# Patient Record
Sex: Female | Born: 1975 | Race: White | Hispanic: No | Smoking: Current every day smoker
Health system: Southern US, Community
[De-identification: ages and names within clinical notes are randomized; demographics above are authoritative.]

## PROBLEM LIST (undated history)

## (undated) DIAGNOSIS — M1990 Unspecified osteoarthritis, unspecified site: Secondary | ICD-10-CM

## (undated) DIAGNOSIS — F209 Schizophrenia, unspecified: Secondary | ICD-10-CM

## (undated) DIAGNOSIS — F4310 Post-traumatic stress disorder, unspecified: Secondary | ICD-10-CM

## (undated) DIAGNOSIS — J189 Pneumonia, unspecified organism: Secondary | ICD-10-CM

## (undated) DIAGNOSIS — M0890 Juvenile arthritis, unspecified, unspecified site: Secondary | ICD-10-CM

## (undated) DIAGNOSIS — K759 Inflammatory liver disease, unspecified: Secondary | ICD-10-CM

## (undated) DIAGNOSIS — R569 Unspecified convulsions: Secondary | ICD-10-CM

## (undated) DIAGNOSIS — F3181 Bipolar II disorder: Secondary | ICD-10-CM

## (undated) DIAGNOSIS — F1910 Other psychoactive substance abuse, uncomplicated: Secondary | ICD-10-CM

## (undated) HISTORY — PX: ABDOMINAL HYSTERECTOMY: SHX81

## (undated) HISTORY — PX: CARDIAC VALVE REPLACEMENT: SHX585

---

## 2008-08-01 ENCOUNTER — Emergency Department (HOSPITAL_COMMUNITY): Admission: EM | Admit: 2008-08-01 | Discharge: 2008-08-01 | Payer: Self-pay | Admitting: Emergency Medicine

## 2010-04-28 LAB — CBC
HCT: 38.8 % (ref 36.0–46.0)
Hemoglobin: 13.7 g/dL (ref 12.0–15.0)
Platelets: 166 10*3/uL (ref 150–400)
WBC: 4.6 10*3/uL (ref 4.0–10.5)

## 2010-04-28 LAB — POCT I-STAT, CHEM 8
BUN: 32 mg/dL — ABNORMAL HIGH (ref 6–23)
Hemoglobin: 13.6 g/dL (ref 12.0–15.0)
Sodium: 133 mEq/L — ABNORMAL LOW (ref 135–145)
TCO2: 23 mmol/L (ref 0–100)

## 2010-04-28 LAB — COMPREHENSIVE METABOLIC PANEL
AST: 29 U/L (ref 0–37)
Albumin: 3.9 g/dL (ref 3.5–5.2)
Calcium: 9.3 mg/dL (ref 8.4–10.5)
Chloride: 106 mEq/L (ref 96–112)
Creatinine, Ser: 0.72 mg/dL (ref 0.4–1.2)
GFR calc Af Amer: >60 mL/min (ref >60)
Total Protein: 7.2 g/dL (ref 6.0–8.3)

## 2010-04-28 LAB — BASIC METABOLIC PANEL
GFR calc Af Amer: >60 mL/min (ref >60)
GFR calc non Af Amer: >60 mL/min (ref >60)
Potassium: 3.2 mEq/L — ABNORMAL LOW (ref 3.5–5.1)
Sodium: 134 mEq/L — ABNORMAL LOW (ref 135–145)

## 2010-04-28 LAB — TRICYCLICS SCREEN, URINE: TCA Scrn: NOT DETECTED

## 2010-04-28 LAB — DIFFERENTIAL
Eosinophils Relative: 2 % (ref 0–5)
Lymphocytes Relative: 26 % (ref 12–46)
Lymphs Abs: 1.2 10*3/uL (ref 0.7–4.0)

## 2010-04-28 LAB — POCT CARDIAC MARKERS: Troponin i, poc: <0.05 ng/mL (ref 0.00–0.09)

## 2010-04-28 LAB — ETHANOL: Alcohol, Ethyl (B): <5 mg/dL (ref 0–10)

## 2016-05-08 ENCOUNTER — Emergency Department (HOSPITAL_BASED_OUTPATIENT_CLINIC_OR_DEPARTMENT_OTHER)
Admission: EM | Admit: 2016-05-08 | Discharge: 2016-05-08 | Disposition: A | Discharge status: Home / Self Care | Payer: Self-pay | Attending: Emergency Medicine | Admitting: Emergency Medicine

## 2016-05-08 ENCOUNTER — Encounter (HOSPITAL_BASED_OUTPATIENT_CLINIC_OR_DEPARTMENT_OTHER): Payer: Self-pay | Admitting: *Deleted

## 2016-05-08 DIAGNOSIS — F17200 Nicotine dependence, unspecified, uncomplicated: Secondary | ICD-10-CM | POA: Insufficient documentation

## 2016-05-08 DIAGNOSIS — L02416 Cutaneous abscess of left lower limb: Secondary | ICD-10-CM | POA: Insufficient documentation

## 2016-05-08 DIAGNOSIS — Z79899 Other long term (current) drug therapy: Secondary | ICD-10-CM | POA: Insufficient documentation

## 2016-05-08 HISTORY — DX: Bipolar II disorder: F31.81

## 2016-05-08 HISTORY — DX: Unspecified osteoarthritis, unspecified site: M19.90

## 2016-05-08 HISTORY — DX: Pneumonia, unspecified organism: J18.9

## 2016-05-08 HISTORY — DX: Other psychoactive substance abuse, uncomplicated: F19.10

## 2016-05-08 HISTORY — DX: Inflammatory liver disease, unspecified: K75.9

## 2016-05-08 HISTORY — DX: Juvenile arthritis, unspecified, unspecified site: M08.90

## 2016-05-08 MED ORDER — DOXYCYCLINE HYCLATE 100 MG PO CAPS: 100.0000 mg | ORAL_CAPSULE | Freq: Two times a day (BID) | ORAL | 0 refills | Status: DC

## 2016-05-08 MED ORDER — DOXYCYCLINE HYCLATE 100 MG PO TABS
100.0000 mg | ORAL_TABLET | Freq: Once | ORAL | Status: AC
  Administered 2016-05-08: 100 mg via ORAL
  Filled 2016-05-08: qty 1

## 2016-05-08 NOTE — ED Provider Notes (Signed)
[facility] Provider Note: Gloria Dell, MD, FACEP  CSN: [id number] MRN: [id number] ARRIVAL: 05/08/16 at 0316 ROOM: MH02/MH02   CHIEF COMPLAINT  Abscess   HISTORY OF PRESENT ILLNESS  Gloria Meyer is a 41 y.o. female with a history of IV drug abuse as well as a history of bacterial endocarditis. She is here with 2 small abscesses on her left knee had been present for about 1-2 weeks. One of them she has picked open and has been draining. The other has not been draining. They're moderately painful to palpation. She has not had a fever. 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 She is requesting antibiotics but is refusing I&D; she intends to follow-up at the Center where her surgery was done.  Past Medical History:  Diagnosis Date  . Arthritis   . Bipolar 2 disorder (HCC)   . Drug abuse    last use 11/17  . Hepatitis   . Juvenile arthritis (HCC)   . Pneumonia     Past Surgical History:  Procedure Laterality Date  . ABDOMINAL HYSTERECTOMY    . CARDIAC VALVE REPLACEMENT      History reviewed. No pertinent family history.  Social History  Substance Use Topics  . Smoking status: Current Every Day Smoker  . Smokeless tobacco: Never Used  . Alcohol use No    Prior to Admission medications   Medication Sig Start Date End Date Taking? Authorizing Provider  buPROPion (WELLBUTRIN XL) 300 MG 24 hr tablet Take 300 mg by mouth daily.   Yes Historical Provider, MD  methadone (DOLOPHINE) 10 MG tablet Take 60 mg by mouth daily.   Yes Historical Provider, MD  QUEtiapine (SEROQUEL) 25 MG tablet Take 50 mg by mouth at bedtime.   Yes Historical Provider, MD    Allergies Sulfa antibiotics   REVIEW OF SYSTEMS  Negative except as noted here or in the History of Present Illness.   PHYSICAL EXAMINATION  Initial Vital Signs Blood pressure (!) 146/109, pulse (!) 103, temperature 98.7  F (37.1 C), temperature source Oral, resp. rate 16, height  (1.651 m), weight 150 lb (68 kg), SpO2 100 %.  Examination General: Well-developed, well-nourished female in no acute distress; appearance consistent with age of record HENT: normocephalic; atraumatic Eyes: pupils equal, round and reactive to light; extraocular muscles intact Neck: supple Heart: regular rate and rhythm; no murmur Lungs: clear to auscultation bilaterally Chest: Well healing midline sternotomy incision Abdomen: soft; nondistended; nontender; bowel sounds present Extremities: No deformity; full range of motion; pulses normal Neurologic: Awake, alert and oriented; motor function intact in all extremities and symmetric; no facial droop Skin: Warm and dry; two lesions left knee, one with crusted center consistent with a draining abscess, the second tender and mildly fluctuant consistent with an early abscess Psychiatric: Normal mood and affect   RESULTS  Summary of this visit's results, reviewed by myself:   EKG Interpretation  Date/Time:    Ventricular Rate:    PR Interval:    QRS Duration:   QT Interval:    QTC Calculation:   R Axis:     Text Interpretation:        Laboratory Studies: No results found for this or any previous visit (from the past 24 hour(s)). Imaging Studies: No results found.  ED COURSE  Nursing notes and initial vitals signs, including pulse oximetry, reviewed.  Vitals:   05/08/16 0333  BP: Marland Kitchen)  146/109  Pulse: (!) 103  Resp: 16  Temp: 98.7 F (37.1 C)  TempSrc: Oral  SpO2: 100%  Weight: 150 lb (68 kg)  Height:  (1.651 m)   4:09 AM As noted above the patient is refusing I&D. We will start her on doxycycline. She intends to follow-up at the center that treated her for endocarditis. She was advised of the importance of returning should she develop fever, chills or other worsening symptoms.  PROCEDURES    ED DIAGNOSES     ICD-9-CM ICD-10-CM   1. Abscess of  left knee 682.6 L02.416        Paula Libra, MD 05/08/16 [phone]

## 2016-09-05 ENCOUNTER — Encounter (HOSPITAL_COMMUNITY): Payer: Self-pay | Admitting: Emergency Medicine

## 2016-09-05 ENCOUNTER — Emergency Department (HOSPITAL_COMMUNITY)
Admission: EM | Admit: 2016-09-05 | Discharge: 2016-09-05 | Disposition: A | Discharge status: Home / Self Care | Payer: Self-pay | Attending: Emergency Medicine | Admitting: Emergency Medicine

## 2016-09-05 DIAGNOSIS — R4182 Altered mental status, unspecified: Secondary | ICD-10-CM | POA: Insufficient documentation

## 2016-09-05 DIAGNOSIS — Y9389 Activity, other specified: Secondary | ICD-10-CM | POA: Insufficient documentation

## 2016-09-05 DIAGNOSIS — Y929 Unspecified place or not applicable: Secondary | ICD-10-CM | POA: Insufficient documentation

## 2016-09-05 DIAGNOSIS — Z79899 Other long term (current) drug therapy: Secondary | ICD-10-CM | POA: Insufficient documentation

## 2016-09-05 DIAGNOSIS — F17210 Nicotine dependence, cigarettes, uncomplicated: Secondary | ICD-10-CM | POA: Insufficient documentation

## 2016-09-05 DIAGNOSIS — Y999 Unspecified external cause status: Secondary | ICD-10-CM | POA: Insufficient documentation

## 2016-09-05 MED ORDER — SODIUM CHLORIDE 0.9 % IV BOLUS (SEPSIS): 1000.0000 mL | Freq: Once | INTRAVENOUS | Status: DC

## 2016-09-05 NOTE — ED Notes (Signed)
Patient refusing all scans, dr talked to her and patient states she is going to bed treated elsewhere. Pt ambulatory and states she is going to see another doctor is [place]

## 2016-09-05 NOTE — ED Notes (Signed)
Pt saying she is refusing blood work and Ct arrived to take pt and pt refused. Dr notified and explained to patient that it is important to at least get CT scans of body. Pt angry and says she does not want to be treated here
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
## 2017-01-10 ENCOUNTER — Encounter (HOSPITAL_BASED_OUTPATIENT_CLINIC_OR_DEPARTMENT_OTHER): Payer: Self-pay | Admitting: Emergency Medicine

## 2017-01-10 ENCOUNTER — Other Ambulatory Visit: Payer: Self-pay

## 2017-01-10 ENCOUNTER — Emergency Department (HOSPITAL_BASED_OUTPATIENT_CLINIC_OR_DEPARTMENT_OTHER)
Admission: EM | Admit: 2017-01-10 | Discharge: 2017-01-10 | Discharge status: Against Medical Advice | Payer: Self-pay | Attending: Emergency Medicine | Admitting: Emergency Medicine

## 2017-01-10 DIAGNOSIS — R569 Unspecified convulsions: Secondary | ICD-10-CM | POA: Insufficient documentation

## 2017-01-10 DIAGNOSIS — F17210 Nicotine dependence, cigarettes, uncomplicated: Secondary | ICD-10-CM | POA: Insufficient documentation

## 2017-01-10 DIAGNOSIS — Z79899 Other long term (current) drug therapy: Secondary | ICD-10-CM | POA: Insufficient documentation

## 2017-01-10 HISTORY — DX: Schizophrenia, unspecified: F20.9

## 2017-01-10 HISTORY — DX: Unspecified convulsions: R56.9

## 2017-01-10 HISTORY — DX: Post-traumatic stress disorder, unspecified: F43.10
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
## 2017-06-12 ENCOUNTER — Other Ambulatory Visit: Payer: Self-pay

## 2017-06-12 ENCOUNTER — Inpatient Hospital Stay (HOSPITAL_BASED_OUTPATIENT_CLINIC_OR_DEPARTMENT_OTHER)
Admission: EM | Admit: 2017-06-12 | Discharge: 2017-06-24 | DRG: 871 | Discharge status: Against Medical Advice | Payer: Self-pay | Attending: Internal Medicine | Admitting: Internal Medicine

## 2017-06-12 ENCOUNTER — Encounter (HOSPITAL_BASED_OUTPATIENT_CLINIC_OR_DEPARTMENT_OTHER): Payer: Self-pay | Admitting: *Deleted

## 2017-06-12 ENCOUNTER — Emergency Department (HOSPITAL_BASED_OUTPATIENT_CLINIC_OR_DEPARTMENT_OTHER): Payer: Self-pay

## 2017-06-12 DIAGNOSIS — B182 Chronic viral hepatitis C: Secondary | ICD-10-CM | POA: Diagnosis present

## 2017-06-12 DIAGNOSIS — Z452 Encounter for adjustment and management of vascular access device: Secondary | ICD-10-CM

## 2017-06-12 DIAGNOSIS — I5032 Chronic diastolic (congestive) heart failure: Secondary | ICD-10-CM | POA: Diagnosis present

## 2017-06-12 DIAGNOSIS — F17210 Nicotine dependence, cigarettes, uncomplicated: Secondary | ICD-10-CM | POA: Diagnosis present

## 2017-06-12 DIAGNOSIS — Z5321 Procedure and treatment not carried out due to patient leaving prior to being seen by health care provider: Secondary | ICD-10-CM | POA: Diagnosis present

## 2017-06-12 DIAGNOSIS — Y831 Surgical operation with implant of artificial internal device as the cause of abnormal reaction of the patient, or of later complication, without mention of misadventure at the time of the procedure: Secondary | ICD-10-CM | POA: Diagnosis present

## 2017-06-12 DIAGNOSIS — Z882 Allergy status to sulfonamides status: Secondary | ICD-10-CM

## 2017-06-12 DIAGNOSIS — R7881 Bacteremia: Secondary | ICD-10-CM | POA: Diagnosis present

## 2017-06-12 DIAGNOSIS — A4102 Sepsis due to Methicillin resistant Staphylococcus aureus: Principal | ICD-10-CM | POA: Diagnosis present

## 2017-06-12 DIAGNOSIS — R6520 Severe sepsis without septic shock: Secondary | ICD-10-CM | POA: Diagnosis present

## 2017-06-12 DIAGNOSIS — I330 Acute and subacute infective endocarditis: Secondary | ICD-10-CM | POA: Diagnosis present

## 2017-06-12 DIAGNOSIS — B1920 Unspecified viral hepatitis C without hepatic coma: Secondary | ICD-10-CM | POA: Diagnosis present

## 2017-06-12 DIAGNOSIS — F1990 Other psychoactive substance use, unspecified, uncomplicated: Secondary | ICD-10-CM | POA: Diagnosis present

## 2017-06-12 DIAGNOSIS — F3181 Bipolar II disorder: Secondary | ICD-10-CM | POA: Diagnosis present

## 2017-06-12 DIAGNOSIS — F209 Schizophrenia, unspecified: Secondary | ICD-10-CM | POA: Diagnosis present

## 2017-06-12 DIAGNOSIS — I4581 Long QT syndrome: Secondary | ICD-10-CM | POA: Diagnosis present

## 2017-06-12 DIAGNOSIS — L02415 Cutaneous abscess of right lower limb: Secondary | ICD-10-CM | POA: Diagnosis present

## 2017-06-12 DIAGNOSIS — T826XXD Infection and inflammatory reaction due to cardiac valve prosthesis, subsequent encounter: Secondary | ICD-10-CM

## 2017-06-12 DIAGNOSIS — B9562 Methicillin resistant Staphylococcus aureus infection as the cause of diseases classified elsewhere: Secondary | ICD-10-CM | POA: Diagnosis present

## 2017-06-12 DIAGNOSIS — D703 Neutropenia due to infection: Secondary | ICD-10-CM | POA: Diagnosis present

## 2017-06-12 DIAGNOSIS — Z90710 Acquired absence of both cervix and uterus: Secondary | ICD-10-CM

## 2017-06-12 DIAGNOSIS — N179 Acute kidney failure, unspecified: Secondary | ICD-10-CM | POA: Diagnosis not present

## 2017-06-12 DIAGNOSIS — L03115 Cellulitis of right lower limb: Secondary | ICD-10-CM | POA: Diagnosis present

## 2017-06-12 DIAGNOSIS — M25552 Pain in left hip: Secondary | ICD-10-CM | POA: Diagnosis present

## 2017-06-12 DIAGNOSIS — G4700 Insomnia, unspecified: Secondary | ICD-10-CM | POA: Diagnosis present

## 2017-06-12 DIAGNOSIS — Z8679 Personal history of other diseases of the circulatory system: Secondary | ICD-10-CM

## 2017-06-12 DIAGNOSIS — D649 Anemia, unspecified: Secondary | ICD-10-CM | POA: Diagnosis present

## 2017-06-12 DIAGNOSIS — M7121 Synovial cyst of popliteal space [Baker], right knee: Secondary | ICD-10-CM | POA: Diagnosis present

## 2017-06-12 DIAGNOSIS — E876 Hypokalemia: Secondary | ICD-10-CM | POA: Diagnosis present

## 2017-06-12 DIAGNOSIS — Z952 Presence of prosthetic heart valve: Secondary | ICD-10-CM

## 2017-06-12 DIAGNOSIS — I38 Endocarditis, valve unspecified: Secondary | ICD-10-CM

## 2017-06-12 DIAGNOSIS — Z4682 Encounter for fitting and adjustment of non-vascular catheter: Secondary | ICD-10-CM

## 2017-06-12 DIAGNOSIS — D6959 Other secondary thrombocytopenia: Secondary | ICD-10-CM | POA: Diagnosis present

## 2017-06-12 DIAGNOSIS — Z8614 Personal history of Methicillin resistant Staphylococcus aureus infection: Secondary | ICD-10-CM

## 2017-06-12 DIAGNOSIS — M25561 Pain in right knee: Secondary | ICD-10-CM | POA: Diagnosis not present

## 2017-06-12 DIAGNOSIS — Z79899 Other long term (current) drug therapy: Secondary | ICD-10-CM

## 2017-06-12 DIAGNOSIS — E875 Hyperkalemia: Secondary | ICD-10-CM | POA: Diagnosis not present

## 2017-06-12 DIAGNOSIS — T826XXA Infection and inflammatory reaction due to cardiac valve prosthesis, initial encounter: Secondary | ICD-10-CM | POA: Diagnosis present

## 2017-06-12 DIAGNOSIS — F4310 Post-traumatic stress disorder, unspecified: Secondary | ICD-10-CM | POA: Diagnosis present

## 2017-06-12 DIAGNOSIS — M79674 Pain in right toe(s): Secondary | ICD-10-CM | POA: Diagnosis not present

## 2017-06-12 DIAGNOSIS — B373 Candidiasis of vulva and vagina: Secondary | ICD-10-CM | POA: Diagnosis not present

## 2017-06-12 DIAGNOSIS — G40909 Epilepsy, unspecified, not intractable, without status epilepticus: Secondary | ICD-10-CM | POA: Diagnosis present

## 2017-06-12 DIAGNOSIS — F1110 Opioid abuse, uncomplicated: Secondary | ICD-10-CM | POA: Diagnosis present

## 2017-06-12 DIAGNOSIS — R52 Pain, unspecified: Secondary | ICD-10-CM

## 2017-06-12 LAB — I-STAT CG4 LACTIC ACID, ED: Lactic Acid, Venous: 1.69 mmol/L (ref 0.5–1.9)

## 2017-06-12 MED ORDER — SODIUM CHLORIDE 0.9 % IV BOLUS
1000.0000 mL | Freq: Once | INTRAVENOUS | Status: AC
  Administered 2017-06-13: 1000 mL via INTRAVENOUS

## 2017-06-12 NOTE — ED Provider Notes (Signed)
[facility] EMERGENCY DEPARTMENT Provider Note   CSN: [id number] Arrival date & time: 06/12/17  2136     History   Chief Complaint Chief Complaint  Patient presents with  . Fever  . Cough    HPI Gloria Meyer is a 42 y.o. female with history of juvenile rheumatoid arthritis, bipolar 2 disorder, IV drug use, schizophrenia, post traumatic stress disorder presents for evaluation of acute onset, per aggressively worsening cough for 3 weeks as well as fever for 1 week.  She notes cough is productive of yellow-green sputum and she has noted progressively worsening shortness of breath over the past few weeks.  She states that she continues to inject heroin daily and noted to areas of swelling and purulent drainage to the right thigh this week where she recently injected.  She notes some tenderness to this area.  She also notes generalized myalgias as well as pain to the left great toe, left hip, and left shoulder.  This worsens with movement and palpation.  She states she has been febrile to 5 F for the past week with improvement with Tylenol.  She endorses nausea but no vomiting.  She tells me that "I do not really feel that the heroin is working anymore ".  She states "this feels like the last time I was septic ".  She is a current smoker but has not been able to smoke for the past week due to her symptoms.  The history is provided by the patient.    Past Medical History:  Diagnosis Date  . Arthritis   . Bipolar 2 disorder (HCC)   . Drug abuse (HCC)    last use 11/17  . Hepatitis   . Juvenile arthritis (HCC)   . Pneumonia   . PTSD (post-traumatic stress disorder)   . Schizophrenia (HCC)   . Seizures (HCC)     There are no active problems to display for this patient.   Past Surgical History:  Procedure Laterality Date  . ABDOMINAL HYSTERECTOMY    . CARDIAC VALVE REPLACEMENT       OB History    Gravida  3   Para  3   Term      Preterm      AB      Living         SAB      TAB      Ectopic      Multiple      Live Births  3            Home Medications    Prior to Admission medications   Medication Sig Start Date End Date Taking? Authorizing Provider  buPROPion (WELLBUTRIN XL) 300 MG 24 hr tablet Take 300 mg by mouth daily.    [provider]  doxycycline (VIBRAMYCIN) 100 MG capsule Take 1 capsule (100 mg total) by mouth 2 (two) times daily. 05/08/16   Molpus, John, MD  methadone (DOLOPHINE) 10 MG tablet Take 60 mg by mouth daily.    [provider]  QUEtiapine (SEROQUEL) 25 MG tablet Take 50 mg by mouth at bedtime.    [provider]    Family History No family history on file.  Social History Social History   Tobacco Use  . Smoking status: Current Every Day Smoker    Types: Cigarettes  . Smokeless tobacco: Never Used  Substance Use Topics  . Alcohol use: Yes    Comment: occasional beer  . Drug use: Yes  Types: IV     Allergies   Sulfa antibiotics   Review of Systems Review of Systems  Constitutional: Positive for chills, fatigue and fever.  Respiratory: Positive for cough and shortness of breath.   Gastrointestinal: Positive for nausea. Negative for abdominal pain and vomiting.  Musculoskeletal: Positive for arthralgias and myalgias.  All other systems reviewed and are negative.    Physical Exam Updated Vital Signs BP 104/82   Pulse (!) 105   Temp (S) 98 F (36.7 C) (Oral) Comment: Reports she took tylenol around 9 pm  Resp 18   Ht  (1.651 m)   Wt 61.6 kg (135 lb 11.2 oz)   SpO2 100%   BMI 22.58 kg/m   Physical Exam  Constitutional: She appears well-developed and well-nourished. No distress.  Resting in bed, appears older than stated age  HENT:  Head: Normocephalic and atraumatic.  Eyes: Conjunctivae are normal. Right eye exhibits no discharge. Left eye exhibits no discharge.  Neck: Normal range of motion. No JVD present. No tracheal deviation present.    Cardiovascular: Regular rhythm. Exam reveals no gallop and no friction rub.  No murmur heard. Tachycardic, 2+ DP/PT pulses and radial pulses bilaterally  Pulmonary/Chest: Effort normal and breath sounds normal. No respiratory distress. She has no wheezes. She has no rales.  Abdominal: Soft. Bowel sounds are normal. She exhibits no distension. There is no tenderness. There is no guarding.  Musculoskeletal: Normal range of motion. She exhibits tenderness. She exhibits no edema or deformity.  Diffuse generalized tenderness to palpation of the extremity is particularly at the left shoulder, left hip, and left great toe.  No erythema or warmth noted to the joints.  Normal range of motion although pain is elicited with flexion extension of the aforementioned joints.  5/5 strength of BUE and BLE major muscle groups.  Neurological: She is alert. No sensory deficit.  Fluent speech, no facial droop, sensation intact to soft touch of extremities, ambulatory without difficulty exhibiting good gait and balance  Skin: Skin is warm and dry. No erythema.  2 scabs noted to the anterior aspect of the right thigh.  No active drainage.  Mild surrounding erythema and induration.  No obvious fluctuance noted.  Track marks noted to the upper and lower extremities.  Psychiatric: She has a normal mood and affect. Her behavior is normal.  Nursing note and vitals reviewed.    ED Treatments / Results  Labs (all labs ordered are listed, but only abnormal results are displayed) Labs Reviewed  CBC WITH DIFFERENTIAL/PLATELET - Abnormal; Notable for the following components:      Result Value   WBC 2.8 (*)    HCT 35.9 (*)    Platelets 79 (*)    All other components within normal limits  CULTURE, BLOOD (ROUTINE X 2)  CULTURE, BLOOD (ROUTINE X 2)  CULTURE, BLOOD (SINGLE)  COMPREHENSIVE METABOLIC PANEL  URINALYSIS, ROUTINE W REFLEX MICROSCOPIC  I-STAT CG4 LACTIC ACID, ED  I-STAT CG4 LACTIC ACID, ED    EKG EKG  Interpretation  Date/Time:  Saturday Jun 12 2017 22:26:32 EDT Ventricular Rate:  97 PR Interval:    QRS Duration: 98 QT Interval:  414 QTC Calculation: 526 R Axis:   73 Text Interpretation:  Sinus rhythm Borderline short PR interval Probable left atrial enlargement RSR' in V1 or V2, probably normal variant Borderline T wave abnormalities Prolonged QT interval t wave inversions in v3-v5 and II and avf are new since 2010 QT has lengthened Confirmed by Particia Nearing,  Raynelle Fanning [phone]) on 06/12/2017 10:32:04 PM   Radiology Dg Chest 2 View  Result Date: 06/12/2017 CLINICAL DATA:  Femur abscess, history of heart surgery EXAM: CHEST - 2 VIEW COMPARISON:  12/20/2015 FINDINGS: Post sternotomy changes with valve prosthesis. No acute opacity or pleural effusion. Normal heart size. No pneumothorax. IMPRESSION: No active cardiopulmonary disease. Electronically Signed   By: Jasmine Pang M.D.   On: 06/12/2017 22:35   Dg Femur, Min 2 Views Right  Result Date: 06/12/2017 CLINICAL DATA:  Right femur abscess near medial distal aspect of the anterior femur EXAM: RIGHT FEMUR 2 VIEWS COMPARISON:  None. FINDINGS: No acute fracture or malalignment. No periostitis or bone destruction. Soft tissue swelling distal thigh. IMPRESSION: No acute osseous abnormality Electronically Signed   By: Jasmine Pang M.D.   On: 06/12/2017 22:34    Procedures Procedures (including critical care time)  Medications Ordered in ED Medications  sodium chloride 0.9 % bolus 1,000 mL (has no administration in time range)  rifampin (RIFADIN) 300 mg in sodium chloride 0.9 % 100 mL IVPB (has no administration in time range)  piperacillin-tazobactam (ZOSYN) IVPB 3.375 g (has no administration in time range)     Initial Impression / Assessment and Plan / ED Course  I have reviewed the triage vital signs and the nursing notes.  Pertinent labs & imaging results that were available during my care of the patient were reviewed by me and considered  in my medical decision making (see chart for details).     Patient presents for evaluation of cough for 3 weeks, fever for 1 week and possible abscesses to the right thigh secondary to IV drug use.  She is afebrile but took Tylenol 1 hour prior to assessment.  She is mildly tachycardic and blood pressure is borderline hypotensive.  X-rays show no acute cardiopulmonary abnormality suggestive of pneumonia or pleural effusion.  X-rays of the femur show soft tissue swelling of the distal thigh but no retained needles.  There are cellulitic changes to the skin of the right thigh but no obvious abscess.  Blood work was obtained including 2 blood cultures however this was delayed due to difficulty obtaining IV access.  Lab work thus far shows lactate within normal limits, neutropenia and thrombocytopenia.  Care everywhere shows that she recently had lab work on 04/17/2017 which showed WBC count of 11 and platelet count of 150. 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  Vancomycin, Zosyn, and rifampin have been ordered.  We are awaiting the remainder of her lab work.  12:22 AM Signed out to oncoming provider Dr. Nicanor Alcon.  Awaiting remainder of lab work.  Patient will likely require admission for TEE and evaluation of possible infected tricuspid valve replacement.  Final Clinical Impressions(s) / ED Diagnoses   Final diagnoses:  Neutropenia associated with infection (HCC)  Cellulitis of right lower extremity  IVDU (intravenous drug user)    ED Discharge Orders    None       Bennye Alm 06/13/17 Juventino Slovak, MD 06/13/17 1945

## 2017-06-12 NOTE — ED Triage Notes (Signed)
Pt reports she has hx of heart valve replacement from IV drug use. States she has been using heroin x 4 months. States last used heroin 2 days ago but has been using methadone instead. Reports she has a wound on her right knee and has been running fevers. Also c/o productive cough and feeling bad. Concerned for bloodstream infection

## 2017-06-12 NOTE — ED Notes (Signed)
IV attempt x 3 without success. Pt has hx of IV drug abuse and is a difficult stick.

## 2017-06-12 NOTE — ED Notes (Signed)
PIV attempted x 2 in [facility], unsuccessful.

## 2017-06-13 ENCOUNTER — Encounter (HOSPITAL_COMMUNITY): Payer: Self-pay | Admitting: Internal Medicine

## 2017-06-13 ENCOUNTER — Observation Stay: Payer: Self-pay

## 2017-06-13 ENCOUNTER — Inpatient Hospital Stay (HOSPITAL_COMMUNITY): Payer: Self-pay

## 2017-06-13 ENCOUNTER — Other Ambulatory Visit (HOSPITAL_COMMUNITY): Payer: Self-pay

## 2017-06-13 DIAGNOSIS — L02415 Cutaneous abscess of right lower limb: Secondary | ICD-10-CM

## 2017-06-13 DIAGNOSIS — Z8679 Personal history of other diseases of the circulatory system: Secondary | ICD-10-CM

## 2017-06-13 DIAGNOSIS — F3181 Bipolar II disorder: Secondary | ICD-10-CM | POA: Diagnosis present

## 2017-06-13 DIAGNOSIS — L089 Local infection of the skin and subcutaneous tissue, unspecified: Secondary | ICD-10-CM | POA: Insufficient documentation

## 2017-06-13 DIAGNOSIS — E876 Hypokalemia: Secondary | ICD-10-CM | POA: Diagnosis present

## 2017-06-13 DIAGNOSIS — B1920 Unspecified viral hepatitis C without hepatic coma: Secondary | ICD-10-CM | POA: Diagnosis present

## 2017-06-13 DIAGNOSIS — F1110 Opioid abuse, uncomplicated: Secondary | ICD-10-CM | POA: Diagnosis present

## 2017-06-13 DIAGNOSIS — T148XXA Other injury of unspecified body region, initial encounter: Secondary | ICD-10-CM

## 2017-06-13 LAB — TROPONIN I: Troponin I: <0.03 ng/mL (ref <0.03)

## 2017-06-13 LAB — COMPREHENSIVE METABOLIC PANEL
ALT: 20 U/L (ref 14–54)
AST: 30 U/L (ref 15–41)
Albumin: 2.8 g/dL — ABNORMAL LOW (ref 3.5–5.0)
Alkaline Phosphatase: 106 U/L (ref 38–126)
Anion gap: 10 (ref 5–15)
BUN: 19 mg/dL (ref 6–20)
CO2: 24 mmol/L (ref 22–32)
Calcium: 8.2 mg/dL — ABNORMAL LOW (ref 8.9–10.3)
Chloride: 96 mmol/L — ABNORMAL LOW (ref 101–111)
Creatinine, Ser: 1.21 mg/dL — ABNORMAL HIGH (ref 0.44–1.00)
GFR calc Af Amer: >60 mL/min (ref >60)
GFR calc non Af Amer: 55 mL/min — ABNORMAL LOW (ref >60)
Glucose, Bld: 93 mg/dL (ref 65–99)
Potassium: 3 mmol/L — ABNORMAL LOW (ref 3.5–5.1)
Sodium: 130 mmol/L — ABNORMAL LOW (ref 135–145)
Total Bilirubin: 1.3 mg/dL — ABNORMAL HIGH (ref 0.3–1.2)
Total Protein: 6.5 g/dL (ref 6.5–8.1)

## 2017-06-13 LAB — RAPID URINE DRUG SCREEN, HOSP PERFORMED
AMPHETAMINES: POSITIVE — AB
BENZODIAZEPINES: NOT DETECTED
Barbiturates: NOT DETECTED
Cocaine: POSITIVE — AB
OPIATES: POSITIVE — AB
TETRAHYDROCANNABINOL: NOT DETECTED

## 2017-06-13 LAB — BLOOD CULTURE ID PANEL (REFLEXED)

## 2017-06-13 LAB — CBC WITH DIFFERENTIAL/PLATELET
Basophils Absolute: 0 10*3/uL (ref 0.0–0.1)
Basophils Relative: 0 %
Eosinophils Absolute: 0 10*3/uL (ref 0.0–0.7)
Eosinophils Relative: 0 %
HCT: 35.9 % — ABNORMAL LOW (ref 36.0–46.0)
Hemoglobin: 12.5 g/dL (ref 12.0–15.0)
Lymphocytes Relative: 8 %
Lymphs Abs: 0.2 10*3/uL — ABNORMAL LOW (ref 0.7–4.0)
MCH: 31.4 pg (ref 26.0–34.0)
MCHC: 34.8 g/dL (ref 30.0–36.0)
MCV: 90.2 fL (ref 78.0–100.0)
Monocytes Absolute: 0.2 10*3/uL (ref 0.1–1.0)
Monocytes Relative: 6 %
Neutro Abs: 2.4 10*3/uL (ref 1.7–7.7)
Neutrophils Relative %: 86 %
Platelets: 79 10*3/uL — ABNORMAL LOW (ref 150–400)
RBC: 3.98 MIL/uL (ref 3.87–5.11)
RDW: 14.2 % (ref 11.5–15.5)
Smear Review: DECREASED
WBC: 2.8 10*3/uL — ABNORMAL LOW (ref 4.0–10.5)

## 2017-06-13 LAB — CBC
HCT: 35.6 % — ABNORMAL LOW (ref 36.0–46.0)
HEMOGLOBIN: 11.8 g/dL — AB (ref 12.0–15.0)
MCH: 30.7 pg (ref 26.0–34.0)
MCHC: 33.1 g/dL (ref 30.0–36.0)
MCV: 92.7 fL (ref 78.0–100.0)
PLATELETS: 52 10*3/uL — AB (ref 150–400)
RBC: 3.84 MIL/uL — ABNORMAL LOW (ref 3.87–5.11)
RDW: 14.7 % (ref 11.5–15.5)
WBC: 4.7 10*3/uL (ref 4.0–10.5)

## 2017-06-13 LAB — URINALYSIS, ROUTINE W REFLEX MICROSCOPIC
Bilirubin Urine: NEGATIVE
Glucose, UA: NEGATIVE mg/dL
Ketones, ur: NEGATIVE mg/dL
Leukocytes, UA: NEGATIVE
Nitrite: NEGATIVE
Protein, ur: NEGATIVE mg/dL
Specific Gravity, Urine: <1.005 — ABNORMAL LOW (ref 1.005–1.030)
pH: 6.5 (ref 5.0–8.0)

## 2017-06-13 LAB — URINALYSIS, MICROSCOPIC (REFLEX)

## 2017-06-13 LAB — CREATININE, SERUM
Creatinine, Ser: 0.86 mg/dL (ref 0.44–1.00)
GFR calc Af Amer: >60 mL/min (ref >60)
GFR calc non Af Amer: >60 mL/min (ref >60)

## 2017-06-13 LAB — SEDIMENTATION RATE: Sed Rate: 37 mm/hr — ABNORMAL HIGH (ref 0–22)

## 2017-06-13 LAB — MAGNESIUM: MAGNESIUM: 2 mg/dL (ref 1.7–2.4)

## 2017-06-13 MED ORDER — SODIUM CHLORIDE 0.9% FLUSH
10.0000 mL | Freq: Two times a day (BID) | INTRAVENOUS | Status: DC
  Administered 2017-06-15 – 2017-06-16 (×2): 10 mL
  Administered 2017-06-16: 3 mL
  Administered 2017-06-17 – 2017-06-24 (×3): 10 mL

## 2017-06-13 MED ORDER — ACETAMINOPHEN 325 MG PO TABS
650.0000 mg | ORAL_TABLET | Freq: Four times a day (QID) | ORAL | Status: DC | PRN
  Administered 2017-06-13 – 2017-06-24 (×9): 650 mg via ORAL
  Filled 2017-06-13 (×10): qty 2

## 2017-06-13 MED ORDER — VANCOMYCIN HCL IN DEXTROSE 1-5 GM/200ML-% IV SOLN
1000.0000 mg | INTRAVENOUS | Status: DC
  Administered 2017-06-13 – 2017-06-14 (×2): 1000 mg via INTRAVENOUS
  Filled 2017-06-13 (×2): qty 200

## 2017-06-13 MED ORDER — PIPERACILLIN-TAZOBACTAM 3.375 G IVPB 30 MIN
3.3750 g | Freq: Once | INTRAVENOUS | Status: AC
  Administered 2017-06-13: 3.375 g via INTRAVENOUS
  Filled 2017-06-13 (×2): qty 50

## 2017-06-13 MED ORDER — IOPAMIDOL (ISOVUE-300) INJECTION 61%
INTRAVENOUS | Status: AC
  Filled 2017-06-13: qty 100

## 2017-06-13 MED ORDER — ENOXAPARIN SODIUM 40 MG/0.4ML ~~LOC~~ SOLN
40.0000 mg | Freq: Every day | SUBCUTANEOUS | Status: DC
  Administered 2017-06-13 – 2017-06-24 (×12): 40 mg via SUBCUTANEOUS
  Filled 2017-06-13 (×11): qty 0.4

## 2017-06-13 MED ORDER — METHADONE HCL 10 MG PO TABS
60.0000 mg | ORAL_TABLET | Freq: Every day | ORAL | Status: DC
  Administered 2017-06-13 – 2017-06-16 (×4): 60 mg via ORAL
  Filled 2017-06-13 (×4): qty 6

## 2017-06-13 MED ORDER — SODIUM CHLORIDE 0.9 % IV BOLUS
1000.0000 mL | Freq: Once | INTRAVENOUS | Status: AC
  Administered 2017-06-13: 1000 mL via INTRAVENOUS

## 2017-06-13 MED ORDER — ONDANSETRON HCL 4 MG/2ML IJ SOLN
4.0000 mg | Freq: Four times a day (QID) | INTRAMUSCULAR | Status: DC | PRN
  Administered 2017-06-19 (×2): 4 mg via INTRAVENOUS
  Filled 2017-06-13 (×2): qty 2

## 2017-06-13 MED ORDER — PIPERACILLIN-TAZOBACTAM 3.375 G IVPB
3.3750 g | Freq: Three times a day (TID) | INTRAVENOUS | Status: DC
  Administered 2017-06-13: 3.375 g via INTRAVENOUS
  Filled 2017-06-13: qty 50

## 2017-06-13 MED ORDER — QUETIAPINE FUMARATE 50 MG PO TABS
50.0000 mg | ORAL_TABLET | Freq: Every day | ORAL | Status: DC
  Administered 2017-06-15 – 2017-06-16 (×2): 50 mg via ORAL
  Filled 2017-06-13 (×4): qty 1

## 2017-06-13 MED ORDER — POTASSIUM CHLORIDE CRYS ER 20 MEQ PO TBCR
80.0000 meq | EXTENDED_RELEASE_TABLET | Freq: Once | ORAL | Status: AC
  Administered 2017-06-13: 80 meq via ORAL
  Filled 2017-06-13: qty 4

## 2017-06-13 MED ORDER — SODIUM CHLORIDE 0.9% FLUSH: 10.0000 mL | INTRAVENOUS | Status: DC | PRN

## 2017-06-13 MED ORDER — ACETAMINOPHEN 650 MG RE SUPP: 650.0000 mg | Freq: Four times a day (QID) | RECTAL | Status: DC | PRN

## 2017-06-13 MED ORDER — ONDANSETRON HCL 4 MG PO TABS
4.0000 mg | ORAL_TABLET | Freq: Four times a day (QID) | ORAL | Status: DC | PRN
  Administered 2017-06-16 – 2017-06-20 (×2): 4 mg via ORAL
  Filled 2017-06-13 (×2): qty 1

## 2017-06-13 MED ORDER — BUPROPION HCL ER (XL) 300 MG PO TB24
300.0000 mg | ORAL_TABLET | Freq: Every day | ORAL | Status: DC
  Administered 2017-06-13 – 2017-06-19 (×5): 300 mg via ORAL
  Filled 2017-06-13 (×11): qty 1

## 2017-06-13 MED ORDER — VANCOMYCIN HCL IN DEXTROSE 1-5 GM/200ML-% IV SOLN
1000.0000 mg | Freq: Once | INTRAVENOUS | Status: AC
  Administered 2017-06-13: 1000 mg via INTRAVENOUS
  Filled 2017-06-13: qty 200

## 2017-06-13 MED ORDER — SODIUM CHLORIDE 0.9 % IV SOLN
300.0000 mg | Freq: Three times a day (TID) | INTRAVENOUS | Status: DC
  Administered 2017-06-13 – 2017-06-14 (×4): 300 mg via INTRAVENOUS
  Filled 2017-06-13 (×6): qty 300

## 2017-06-13 MED ORDER — IBUPROFEN 800 MG PO TABS
800.0000 mg | ORAL_TABLET | Freq: Four times a day (QID) | ORAL | Status: DC | PRN
  Administered 2017-06-13 – 2017-06-22 (×10): 800 mg via ORAL
  Filled 2017-06-13 (×11): qty 1

## 2017-06-13 NOTE — Procedures (Signed)
Central Venous Catheter Insertion Procedure Note Gloria Meyer [id number] 11-Apr-1975  Procedure: Insertion of Central Venous Catheter Indications: Drug and/or fluid administration  Procedure Details Consent: Risks of procedure as well as the alternatives and risks of each were explained to the (patient/caregiver).  Consent for procedure obtained. Time Out: Verified patient identification, verified procedure, site/side was marked, verified correct patient position, special equipment/implants available, medications/allergies/relevent history reviewed, required imaging and test results available.  Performed  Maximum sterile technique was used including antiseptics, cap, gloves, gown, hand hygiene, mask and sheet. Skin prep: Chlorhexidine; local anesthetic administered A antimicrobial bonded/coated triple lumen catheter was placed in the right internal jugular vein using the Seldinger technique.  Evaluation Blood flow good Complications: No apparent complications Patient did tolerate procedure well. Chest X-ray ordered to verify placement.  CXR: pending.  Gloria Meyer 06/13/2017, 3:06 PM

## 2017-06-13 NOTE — Progress Notes (Signed)
PHARMACY - PHYSICIAN COMMUNICATION CRITICAL VALUE ALERT - BLOOD CULTURE IDENTIFICATION (BCID)  Gloria Meyer is an 43 y.o. female who presented to [facility] on 06/12/2017 with a chief complaint of thigh abscess, hx MRSA endocarditis .  Pharmacy has been consulted for vancomycin dosing.   Assessment:  Hx Heroin abuse, hx MRSA tricuspid valve endocarditis. ID automatically notified by e-mail with Staph aureus BCID  Name of physician (or Provider) ContactedAlvino Chapel  Current antibiotics: Vancomycin, Zosyn, Rifampin  Changes to prescribed antibiotics recommended:  Patient is on recommended antibiotics - No changes needed  Results for orders placed or performed during the hospital encounter of 06/12/17  Blood Culture ID Panel (Reflexed) (Collected: 06/12/2017 11:36 PM)  Result Value Ref Range   Enterococcus species NOT DETECTED NOT DETECTED   Listeria monocytogenes NOT DETECTED NOT DETECTED   Staphylococcus species DETECTED (A) NOT DETECTED   Staphylococcus aureus DETECTED (A) NOT DETECTED   Methicillin resistance DETECTED (A) NOT DETECTED   Streptococcus species NOT DETECTED NOT DETECTED   Streptococcus agalactiae NOT DETECTED NOT DETECTED   Streptococcus pneumoniae NOT DETECTED NOT DETECTED   Streptococcus pyogenes NOT DETECTED NOT DETECTED   Acinetobacter baumannii NOT DETECTED NOT DETECTED   Enterobacteriaceae species NOT DETECTED NOT DETECTED   Enterobacter cloacae complex NOT DETECTED NOT DETECTED   Escherichia coli NOT DETECTED NOT DETECTED   Klebsiella oxytoca NOT DETECTED NOT DETECTED   Klebsiella pneumoniae NOT DETECTED NOT DETECTED   Proteus species NOT DETECTED NOT DETECTED   Serratia marcescens NOT DETECTED NOT DETECTED   Haemophilus influenzae NOT DETECTED NOT DETECTED   Neisseria meningitidis NOT DETECTED NOT DETECTED   Pseudomonas aeruginosa NOT DETECTED NOT DETECTED   Candida albicans NOT DETECTED NOT DETECTED   Candida glabrata NOT DETECTED NOT DETECTED   Candida  krusei NOT DETECTED NOT DETECTED   Candida parapsilosis NOT DETECTED NOT DETECTED   Candida tropicalis NOT DETECTED NOT DETECTED    Chilton Si, Alessio Bogan L 06/13/2017  3:25 PM

## 2017-06-13 NOTE — Progress Notes (Signed)
Spoke with Dr Alvino Chapel re PICC vs midline, blood cultures pending, pt with significant hx of infections and concern re IVDU with PICC line.  Dr. Alvino Chapel orders to place midline not PICC line.

## 2017-06-13 NOTE — H&P (Signed)
History and Physical    Cooper Stamp ZOX:[id number] DOB: November 16, 1975 DOA: 06/12/2017  PCP: Patient, No Pcp Per  Patient coming from: Home  Chief Complaint: Fevers, chills, general malaise, right leg abscesses   HPI: Gloria Meyer is a 42 y.o. 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  She states that she relapsed about 3 months ago and has been injecting heroin into her right thigh.  About 2 weeks ago, she noticed 2 sites of abscess.  She lysed it open with a Narcan needle at home and drained white pus that was mixed with brown.  Then about 5 days ago, she started to feel ill with general malaise, fatigue, fever, chills, sweats.  She also admits to productive cough of yellow sputum.  She denies any chest pain.  Has some nausea and has not been able to keep down any food for the past 5 days.  No abdominal pain or diarrhea.  ED Course: Chest x-ray negative for acute cardiopulmonary abnormality.  X-ray right femur shows soft tissue swelling of the distal thigh but no retained needle or osseous abnormality.  Blood cultures obtained.  She was started on IV vancomycin, Zosyn, rifampin. [facility] asked for admission for possible endocarditis, sepsis work up.   Review of Systems: As per HPI otherwise 10 point review of systems negative.   Past Medical History:  Diagnosis Date  . Arthritis   . Bipolar 2 disorder (HCC)   . Drug abuse (HCC)    last use 11/17  . Hepatitis   . Juvenile arthritis (HCC)   . Pneumonia   . PTSD (post-traumatic stress disorder)   . Schizophrenia (HCC)   . Seizures (HCC)     Past Surgical History:  Procedure Laterality Date  . ABDOMINAL HYSTERECTOMY    . CARDIAC VALVE REPLACEMENT       reports that she has been smoking cigarettes.  She has never used smokeless tobacco. She reports that she drinks alcohol. She reports that she has current or past drug history. Drug: IV.  Allergies  Allergen Reactions  . Sulfa Antibiotics Rash    Family History  Problem Relation Age of Onset  . Diabetes Father      Prior to Admission medications   Medication Sig Start Date End Date Taking? Authorizing Provider  buPROPion (WELLBUTRIN XL) 300 MG 24 hr tablet Take 300 mg by mouth daily.    [provider]  doxycycline (VIBRAMYCIN) 100 MG capsule Take 1 capsule (100 mg total) by mouth 2 (two) times daily. 05/08/16   Molpus, John, MD  methadone (DOLOPHINE) 10 MG tablet Take 60 mg by mouth daily.    [provider]  QUEtiapine (SEROQUEL) 25 MG tablet Take 50 mg by mouth at bedtime.    [provider]    Physical Exam: Vitals:   06/13/17 0444 06/13/17 0545 06/13/17 0635 06/13/17 0638  BP: 92/66 90/61  (!) 99/59  Pulse:    93  Resp: (!) 21 (!) 21  18  Temp:    98.5 F (36.9 C)  TempSrc:    Oral  SpO2:    99%  Weight:   62.9 kg (138 lb 10.7 oz)   Height:    (1.651 m)     Constitutional: NAD, calm, comfortable  Eyes: PERRL, lids and conjunctivae normal ENMT: Mucous membranes are moist. Posterior pharynx clear of any exudate or lesions Neck: normal, supple, no masses, no thyromegaly Respiratory: clear to auscultation bilaterally, no wheezing, no crackles. Normal respiratory effort. No accessory muscle use.  Cardiovascular: Regular rate and rhythm. No extremity edema. Abdomen: no tenderness, no masses palpated. No hepatosplenomegaly. Bowel sounds positive.  Musculoskeletal: no clubbing / cyanosis. No joint deformity upper and lower extremities. Good ROM. Normal muscle tone.  Skin: Right thigh    Neurologic: CN 2-12 grossly intact. Strength 5/5 in all 4.  Psychiatric: Stable judgment and insight. Alert and oriented x 3. Normal mood.   Labs on  Admission: I have personally reviewed following labs and imaging studies  CBC: Recent Labs  Lab 06/12/17 2335  WBC 2.8*  NEUTROABS 2.4  HGB 12.5  HCT 35.9*  MCV 90.2  PLT 79*   Basic Metabolic Panel: Recent Labs  Lab 06/12/17 2335  NA 130*  K 3.0*  CL 96*  CO2 24  GLUCOSE 93  BUN 19  CREATININE 1.21*  CALCIUM 8.2*   GFR: Estimated Creatinine Clearance: 55.6 mL/min (A) (by C-G formula based on SCr of 1.21 mg/dL (H)). Liver Function Tests: Recent Labs  Lab 06/12/17 2335  AST 30  ALT 20  ALKPHOS 106  BILITOT 1.3*  PROT 6.5  ALBUMIN 2.8*   No results for input(s): LIPASE, AMYLASE in the last 168 hours. No results for input(s): AMMONIA in the last 168 hours. Coagulation Profile: No results for input(s): INR, PROTIME in the last 168 hours. Cardiac Enzymes: Recent Labs  Lab 06/12/17 2335  TROPONINI <0.03   BNP (last 3 results) No results for input(s): PROBNP in the last 8760 hours. HbA1C: No results for input(s): HGBA1C in the last 72 hours. CBG: No results for input(s): GLUCAP in the last 168 hours. Lipid Profile: No results for input(s): CHOL, HDL, LDLCALC, TRIG, CHOLHDL, LDLDIRECT in the last 72 hours. Thyroid Function Tests: No results for input(s): TSH, T4TOTAL, FREET4, T3FREE, THYROIDAB in the last 72 hours. Anemia Panel: No results for input(s): VITAMINB12, FOLATE, FERRITIN, TIBC, IRON, RETICCTPCT in the last 72 hours. Urine analysis:    Component Value Date/Time   COLORURINE YELLOW 06/13/2017 0540   APPEARANCEUR CLOUDY (A) 06/13/2017 0540   LABSPEC <1.005 (L) 06/13/2017 0540   PHURINE 6.5 06/13/2017 0540   GLUCOSEU NEGATIVE 06/13/2017 0540   HGBUR MODERATE (A) 06/13/2017 0540   BILIRUBINUR NEGATIVE 06/13/2017 0540   KETONESUR NEGATIVE 06/13/2017 0540   PROTEINUR NEGATIVE 06/13/2017 0540   NITRITE NEGATIVE 06/13/2017 0540   LEUKOCYTESUR NEGATIVE 06/13/2017 0540   Sepsis Labs:  !!!!!!!!!!!!!!!!!!!!!!!!!!!!!!!!!!!!!!!!!!!! (procalcitonin:4,lacticidven:4) )No results found for this or any previous visit (from the past 240 hour(s)).   Radiological Exams on Admission: Dg Chest 2 View  Result Date: 06/12/2017 CLINICAL DATA:  Femur abscess, history of heart surgery EXAM: CHEST - 2 VIEW COMPARISON:  12/20/2015 FINDINGS: Post sternotomy changes with valve prosthesis. No acute opacity or pleural effusion. Normal heart size. No pneumothorax. IMPRESSION: No active cardiopulmonary disease. Electronically Signed   By: Jasmine Pang M.D.   On: 06/12/2017 22:35   Dg Femur, Min 2 Views Right  Result Date: 06/12/2017 CLINICAL DATA:  Right femur abscess near medial distal aspect of the anterior femur EXAM: RIGHT FEMUR 2 VIEWS COMPARISON:  None. FINDINGS: No acute fracture or malalignment. No periostitis or bone destruction. Soft tissue swelling distal thigh. IMPRESSION: No acute osseous abnormality Electronically Signed   By: Adrian Prows.D.  On: 06/12/2017 22:34   [place] Ekg Site Rite  Result Date: 06/13/2017 If Site Rite image not attached, placement could not be confirmed due to current cardiac rhythm.   EKG: Independently reviewed. Normal sinus rhythm, T wave inv septal leads, QTc 526   Assessment/Plan Principal Problem:   Abscess of right leg Active Problems:   Wound infection   H/O bacterial endocarditis   Heroin abuse (HCC)   Hepatitis C   Bipolar 2 disorder (HCC)   Hypokalemia   Right thigh abscesses  -CT femur to eval for deeper wound infection that may need drainage. 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 I have sent message to cardiology to schedule for TEE this week  -Check HIV    Heroin abuse -Used to follow with methadone clinic and relapsed 3 months ago -Resume methadone, watch QTc  -Check urine drug screen   Hep C 1a -Patient states she has not received tx for this, despite recommendation -Check RNA   Bipolar disorder -Continue wellbutrin, seroquel  Hypokalemia -Replaced, trend  -Check Mg      DVT prophylaxis: Lovenox Code Status: Full  Family Communication: No family at bedside Disposition Plan: Pending further work up, SNF vs home setting depending on antibiotic needs  Consults called: None   Admission status: Inpatient    Severity of Illness: The appropriate patient status for this patient is INPATIENT. Inpatient status is judged to be reasonable and necessary in order to provide the required intensity of service to ensure the patient's safety. The patient's presenting symptoms, physical exam findings, and initial radiographic and laboratory data in the context of their chronic comorbidities is felt to place them at high risk for further clinical deterioration. Furthermore, it is not anticipated that the patient will be medically stable for discharge from the hospital within 2 midnights of admission.   Noralee Stain, DO [facility] [email or web address] Password [facility] 06/13/2017, 8:26 AM

## 2017-06-13 NOTE — ED Notes (Signed)
Lab informed of add on tests.

## 2017-06-13 NOTE — Plan of Care (Addendum)
Discussed care with Dr. Nicanor Alcon at [facility].  Gloria Meyer is a 42 year old female with pmh of treatment on RA, Bipolar, IV drug user(heroin), endocarditis requiring valve replacement, PTSD, and schizophrenia; who presented with complaints of fever and cough.  Patient with continued IV drug use and notes draining wound from right thigh where previously has injected.   Vital signs currently noted to be stable. Labs revealed WBC 2.8, platelets 79, lactic acid 1.69, sodium 130, potassium 3, BUN 19, and creatinine 1.21.  X-rays of the right femur showed no acute osseous abnormality.  Chest x-ray was otherwise clear of any acute abnormalities.  Blood cultures were obtained.  Patient was started on empiric antibiotics of vancomycin and Zosyn.  Rifampin was also ordered but not available at [facility]. Dr. Nicanor Alcon make note of possible concern for endocarditis with a  non native valve in addition to underlying wound infection.  Initially accepted to [facility] for possible need of consultative services.

## 2017-06-13 NOTE — ED Notes (Signed)
Rifampin not given due to med being unavailable at this facility. EDP aware.

## 2017-06-13 NOTE — Progress Notes (Signed)
Pharmacy Antibiotic Note  Gloria Meyer is a 42 y.o. female admitted on 06/12/2017 with thigh abscess, hx MRSA endocarditis .  Pharmacy has been consulted for vancomycin dosing. MD dosing rifampin and zosyn.  Plan: Vancomycin 1g IV q24h for estimated AUC 483 using SCr 1.21 Check vancomycin levels at steady state, goal AUC 400-500 Follow up renal function & cultures  Height:  (165.1 cm) Weight: 138 lb 10.7 oz (62.9 kg) IBW/kg (Calculated) : 57  Temp (24hrs), Avg:98.1 F (36.7 C), Min:97.9 F (36.6 C), Max:98.5 F (36.9 C)  Recent Labs  Lab 06/12/17 2335 06/12/17 2349  WBC 2.8*  --   CREATININE 1.21*  --   LATICACIDVEN  --  1.69    Estimated Creatinine Clearance: 55.6 mL/min (A) (by C-G formula based on SCr of 1.21 mg/dL (H)).    Allergies  Allergen Reactions  . Sulfa Antibiotics Rash    Antimicrobials this admission:  5/26 Vanc >> 5/26 Rifampin >> 5/26 Zosyn >>  Dose adjustments this admission:    Microbiology results:  5/25 BCx:  Thank you for allowing pharmacy to be a part of this patient's care.  Loralee Pacas, PharmD, BCPS Pager: [phone] 06/13/2017 8:54 AM

## 2017-06-13 NOTE — Progress Notes (Signed)
      INFECTIOUS DISEASE ATTENDING ADDENDUM:   Date: 06/13/2017  Patient name: Gloria Meyer  Medical record number: [id number]  Date of birth: 1975/07/20    ,     [facility] Staphylococcus aureus bacteremia   Staphylococcus aureus bacteremia (SAB) is associated with a high rate of complications and mortality.  Specific aspects of clinical management are critical to optimizing the outcome of patients with SAB.  Therefore, the [facility] Antimicrobial Management Team [facility]) has initiated an intervention aimed at improving the management of SAB at [facility].  To do so, Infectious Diseases physicians are providing an evidence-based consult for the management of all patients with SAB.     Yes No Comments  Perform follow-up blood cultures (even if the patient is afebrile) to ensure clearance of bacteremia     Remove vascular catheter and obtain follow-up blood cultures after the removal of the catheter   CENTRAL LINE PLACED TODAY WILL NEED TO BE REMOVED TO EFFECT CATHETER HOLIDAY  Perform echocardiography to evaluate for endocarditis (transthoracic ECHO is 40-50% sensitive, TEE is > 90% sensitive)   Please keep in mind, that neither test can definitively EXCLUDE endocarditis, and that should clinical suspicion remain high for endocarditis the patient should then still be treated with an "endocarditis" duration of therapy = 6 weeks  Consult electrophysiologist to evaluate implanted cardiac device (pacemaker, ICD)     Ensure source control   Have all abscesses been drained effectively? Have deep seeded infections (septic joints or osteomyelitis) had appropriate surgical debridement? SHE HAS CT SCHEDULED AND WILL NEED FORMAL I AND D IT SEEMS  Investigate for "metastatic" sites of infection   Does the patient have ANY symptom or physical exam finding that would suggest a deeper infection (back or neck pain that may be suggestive of  vertebral osteomyelitis or epidural abscess, muscle pain that could be a symptom of pyomyositis)?  Keep in mind that for deep seeded infections MRI imaging with contrast is preferred rather than other often insensitive tests such as plain x-rays, especially early in a patient's presentation.  Change antibiotic therapy to VANCOMYCIN + RIFAMPIN AND GENTAMICINN LATER IF SHE TURNS OUT TO HAVE PVE   Beta-lactam antibiotics are preferred for MSSA due to higher cure rates.   If on Vancomycin, goal trough should be 15 - 20 mcg/mL  Estimated duration of IV antibiotic therapy:  6 WEEKS   Consult case management for probably prolonged outpatient IV antibiotic therapy    FORMAL CONSULT IN AM.  Paulette Blanch Dam 06/13/2017, 3:56 PM

## 2017-06-13 NOTE — ED Notes (Signed)
Pt IV infiltrated. EDP made aware, pt refuses any further sticks at this facility.

## 2017-06-13 NOTE — Progress Notes (Signed)
Unable to perform echo.  Attempted twice however patient has been in two sterile procedures (in-room).  Will attempt at a later time.

## 2017-06-13 NOTE — Progress Notes (Signed)
IV Team note;  Pt seen by 2 IV Team RNs this morning;  Ultrasound used; 2 attempts made, one in R AFA, , one in LAFA;  Each attempt gave great blood return, but infiltrated w NS flush;  Upper R arm assessed with ultrasound; good basilic vein noted but pt is refusing any further attempts at "this stickfest", saying she "has been stuck 18 times now today."  She is insisting on a PICC line ONLY, as she "has had a lot of them".   Spoke with RN caring for the pt today;  Will also contact Picc Nurse regarding this pt.    Barkley Bruns RN IV Team

## 2017-06-13 NOTE — Progress Notes (Signed)
IV Team Note;  Have attempted 2 more times today to obtain IV access for this pt;  Attempted Right upper arm midline; cannot thread wire or catheter despite great blood return;  Then attempted Left upper arm midline:  Same issue; cannot thread wire or catheter into vein, even though great blood return;  Pt says she has "had piccs put in in the radiology department."   RN aware of 2 more unsuccessful attempts at obtaining iv access for antibiotics;  She will notify MD.    Barkley Bruns RN IV Team

## 2017-06-13 NOTE — ED Notes (Addendum)
Paged Dr. Arlyss Queen @ 3:05 am  to clarify the change from [facility] to [facility].  Bed Control Corrie Dandy) said it was discussed between them and decided to admit patient to [facility], however, there are no telemetry beds available at [facility] at this time.  Awaiting response from Dr. Katrinka Blazing.   Repaged Dr. Katrinka Blazing  Contacted [facility])  And asked her to page Dr. Arlyss Queen to secretary's phone

## 2017-06-14 ENCOUNTER — Inpatient Hospital Stay (HOSPITAL_COMMUNITY): Payer: Self-pay

## 2017-06-14 DIAGNOSIS — Z8679 Personal history of other diseases of the circulatory system: Secondary | ICD-10-CM

## 2017-06-14 DIAGNOSIS — Z882 Allergy status to sulfonamides status: Secondary | ICD-10-CM

## 2017-06-14 DIAGNOSIS — R51 Headache: Secondary | ICD-10-CM

## 2017-06-14 DIAGNOSIS — R05 Cough: Secondary | ICD-10-CM

## 2017-06-14 DIAGNOSIS — I5030 Unspecified diastolic (congestive) heart failure: Secondary | ICD-10-CM

## 2017-06-14 DIAGNOSIS — G4700 Insomnia, unspecified: Secondary | ICD-10-CM

## 2017-06-14 DIAGNOSIS — R7881 Bacteremia: Secondary | ICD-10-CM

## 2017-06-14 DIAGNOSIS — E876 Hypokalemia: Secondary | ICD-10-CM

## 2017-06-14 DIAGNOSIS — F3181 Bipolar II disorder: Secondary | ICD-10-CM

## 2017-06-14 DIAGNOSIS — R42 Dizziness and giddiness: Secondary | ICD-10-CM

## 2017-06-14 DIAGNOSIS — B182 Chronic viral hepatitis C: Secondary | ICD-10-CM

## 2017-06-14 DIAGNOSIS — R002 Palpitations: Secondary | ICD-10-CM

## 2017-06-14 DIAGNOSIS — F1910 Other psychoactive substance abuse, uncomplicated: Secondary | ICD-10-CM

## 2017-06-14 DIAGNOSIS — R079 Chest pain, unspecified: Secondary | ICD-10-CM

## 2017-06-14 DIAGNOSIS — T148XXA Other injury of unspecified body region, initial encounter: Secondary | ICD-10-CM

## 2017-06-14 DIAGNOSIS — B9562 Methicillin resistant Staphylococcus aureus infection as the cause of diseases classified elsewhere: Secondary | ICD-10-CM

## 2017-06-14 DIAGNOSIS — Z954 Presence of other heart-valve replacement: Secondary | ICD-10-CM

## 2017-06-14 DIAGNOSIS — F17210 Nicotine dependence, cigarettes, uncomplicated: Secondary | ICD-10-CM

## 2017-06-14 LAB — ECHOCARDIOGRAM COMPLETE
HEIGHTINCHES: 65 in
Weight: 2218.71 oz

## 2017-06-14 LAB — BASIC METABOLIC PANEL
Anion gap: 7 (ref 5–15)
BUN: 15 mg/dL (ref 6–20)
CHLORIDE: 101 mmol/L (ref 101–111)
CO2: 26 mmol/L (ref 22–32)
Calcium: 8.2 mg/dL — ABNORMAL LOW (ref 8.9–10.3)
Creatinine, Ser: 0.75 mg/dL (ref 0.44–1.00)
GFR calc non Af Amer: >60 mL/min (ref >60)
Glucose, Bld: 132 mg/dL — ABNORMAL HIGH (ref 65–99)
Potassium: 3 mmol/L — ABNORMAL LOW (ref 3.5–5.1)
SODIUM: 134 mmol/L — AB (ref 135–145)

## 2017-06-14 LAB — LACTIC ACID, PLASMA
LACTIC ACID, VENOUS: 1.8 mmol/L (ref 0.5–1.9)
Lactic Acid, Venous: 1.6 mmol/L (ref 0.5–1.9)

## 2017-06-14 LAB — CBC
HEMATOCRIT: 31.8 % — AB (ref 36.0–46.0)
Hemoglobin: 10.6 g/dL — ABNORMAL LOW (ref 12.0–15.0)
MCH: 30.5 pg (ref 26.0–34.0)
MCHC: 33.3 g/dL (ref 30.0–36.0)
MCV: 91.4 fL (ref 78.0–100.0)
Platelets: 48 10*3/uL — ABNORMAL LOW (ref 150–400)
RBC: 3.48 MIL/uL — ABNORMAL LOW (ref 3.87–5.11)
RDW: 14.8 % (ref 11.5–15.5)
WBC: 4.6 10*3/uL (ref 4.0–10.5)

## 2017-06-14 LAB — PROCALCITONIN: PROCALCITONIN: 12.25 ng/mL

## 2017-06-14 MED ORDER — SODIUM CHLORIDE 0.9 % IV BOLUS
1000.0000 mL | Freq: Once | INTRAVENOUS | Status: AC
  Administered 2017-06-14: 1000 mL via INTRAVENOUS

## 2017-06-14 MED ORDER — CLOTRIMAZOLE 1 % VA CREA
1.0000 | TOPICAL_CREAM | Freq: Every day | VAGINAL | Status: DC
  Filled 2017-06-14: qty 45

## 2017-06-14 MED ORDER — SODIUM CHLORIDE 0.9 % IV SOLN
INTRAVENOUS | Status: DC
  Administered 2017-06-14 – 2017-06-16 (×5): via INTRAVENOUS

## 2017-06-14 MED ORDER — DIPHENHYDRAMINE HCL 50 MG PO CAPS
50.0000 mg | ORAL_CAPSULE | Freq: Once | ORAL | Status: AC
  Administered 2017-06-14: 50 mg via ORAL
  Filled 2017-06-14: qty 1

## 2017-06-14 NOTE — Progress Notes (Addendum)
0930 : Found pt with chills temp 97.4. Shaking uncontrollably .... MD on unit. Sepsis work up. Fluids given at 30cc/kg.... BP 98/41 ....   After bolus temp 98.4 HR 98 R 22 BP 130/62  1125: Call from CT indicating that CT cannot be done with contrast due to no peripheral site. IV to come and reassess to see if they can get a peripheral site.  1300: Person identifying him self as a friend stated that he wanted the pt tx to [facility]. He said if we did not send pt to.  [facility] he would take her out himself. MD paged and aware. Went and spoke with pt and she did not indicate that she wanted to leave.She stated that she only wanted a second opinion. Explained the process of a transfer.  Also explained to the friend that pt is competent and able to speak and voice concerns. He wanted to be made POA. Explained to him that the MD caring for patient would come back to see him later.  1400: Friend has left and pt without complaints    Note open time: Cont to monitor patient throughout shift. Pt without complaints of at this time.... IT team consult placed to see if they can get a PIV site. Per CT they cannot use IJ to give IV contrast. RN assuming care of pt is aware. Will update CN as well  Cont with plan of care

## 2017-06-14 NOTE — Progress Notes (Signed)
  Echocardiogram 2D Echocardiogram has been performed.  Janalyn Harder 06/14/2017, 10:20 AM

## 2017-06-14 NOTE — Progress Notes (Signed)
PROGRESS NOTE    Gloria Meyer  ZOX:[id number] DOB: 17-Apr-1975 DOA: 06/12/2017 PCP: Patient, No Pcp Per   Brief Narrative:  Gloria Meyer is a 42 y.o. 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    She states that she relapsed about 3 months ago and has been injecting heroin into her right thigh.  About 2 weeks ago, she noticed 2 sites of abscess.  She lysed it open with a Narcan needle at home and drained white pus that was mixed with brown.  Then about 5 days ago, she started to feel ill with general malaise, fatigue, fever, chills, sweats.  She also admits to productive cough of yellow sputum.  She denies any chest pain.  Has some nausea and has not been able to keep down any food for the past 5 days.  No abdominal pain or diarrhea.  In the ED the Chest x-ray negative for acute cardiopulmonary abnormality.  X-ray right femur shows soft tissue swelling of the distal thigh but no retained needle or osseous abnormality.  Blood cultures obtained.  She was started on IV vancomycin, Zosyn, rifampin. [facility] asked for admission for possible Endocarditis. Blood Cultures are positive for MRSA; Currently being worked up. This AM appeared Septic so 2 Liters of IVF given and started on Maintenance of 100 mL/hr.   Assessment & Plan:   Principal Problem:   Abscess of right leg Active Problems:   Wound infection   H/O bacterial endocarditis   Heroin abuse (HCC)   Hepatitis C   Bipolar 2 disorder (HCC)   Hypokalemia  Sepsis 2/2 to Right thigh abscesses with MRSA bacteremia  -Patient was tachycardic, Tachypenic, Hypotensive, and has a source of Infection with Bacteremia this AM -CT femur to eval for deeper wound infection  that may need drainage. On physical exam, does not have any surrounding erythema, but appears to be healing/scabbing  -Vanc/zosyn/rifampin started in ED due to hx of endocarditis and changed to Vancomycin per ID -Blood cultures showed Staphylococcus bacteremia and was MRSA -Infectious disease already consulted on medically -Given 2 Liters bolus (30 mg/kg) and then started on IVF Maintenance -Patient had central venous catheter placed yesterday however will need to be removed given her bacteremia and high risk of seeding -Per infectious disease Dr. Orvis Brill need to be removed at some point may consider a long-acting drug such as " IV ORITAVANCIN vs Dalbavancin to facilitate this vs oral drug such as zyvox that has high bioavailability" -Checking TTE and will need TEE -Checked Procalcitonin and LA -Procalcitonin was 12.25; Lactic Acid Level went from 1.8 -> 1.6 -Per Dr. 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 daily as well as 50 mg of  Quetiapine nightly  Hypokalemia -Patient's potassium this morning was 3.0 -Replete with Potassium chloride 40 mEq p.o. twice daily x2 doses -Continue to monitor and replete as necessary -Repeat CMP in the a.m.  Normocytic Anemia -Patient's hemoglobin/hematocrit went from 12.5/35.9 on admission is now 10.6/31.8 -Likely dilutional drop from IV fluid resuscitation -Continue to monitor for signs and symptoms of bleeding -Check anemia panel -Repeat CBC in the a.m.  Thrombocytopenia -Patient's platelet count on admission was 79 and has dropped all the way down to 48 -Likely reactive due to infectious -Continue to monitor and repeat CBC in a.m.  DVT prophylaxis: Enoxaparin 40 mg subcu every 24 Code Status: FULL CODE Family Communication: Family member asleep at bedside Disposition Plan: Remain Inpatient for current evaluation and workup  Consultants:   Infectious Diseases    Procedures: CVC Insertion by Dr. Chilton Greathouse  ECHOCARDIOGRAM ------------------------------------------------------------------- Study Conclusions  - Left ventricle: The cavity size was normal. Wall thickness was   normal. Systolic function was normal. The estimated ejection   fraction was in the range of 60% to 65%. Wall motion was normal;   there were no regional wall motion abnormalities. Features are   consistent with a pseudonormal left ventricular filling pattern,   with concomitant abnormal relaxation and increased filling   pressure (grade 2 diastolic dysfunction). - Tricuspid valve: A bioprosthesis was present.  Impressions:  - Normal LV systolic function; moderate diastolic dysfunction; s/p   TVR.  Antimicrobials: Anti-infectives (From admission, onward)   Start     Dose/Rate Route Frequency Ordered Stop   06/13/17 2200  vancomycin (VANCOCIN) IVPB 1000 mg/200 mL premix     1,000 mg 200 mL/hr over 60 Minutes Intravenous Every 24 hours 06/13/17 0852     06/13/17 0830   piperacillin-tazobactam (ZOSYN) IVPB 3.375 g  Status:  Discontinued     3.375 g 12.5 mL/hr over 240 Minutes Intravenous Every 8 hours 06/13/17 0823 06/13/17 1555   06/13/17 0030  rifampin (RIFADIN) 300 mg in sodium chloride 0.9 % 100 mL IVPB     300 mg 200 mL/hr over 30 Minutes Intravenous Every 8 hours 06/13/17 0017     06/13/17 0030  piperacillin-tazobactam (ZOSYN) IVPB 3.375 g     3.375 g 100 mL/hr over 30 Minutes Intravenous  Once 06/13/17 0017 06/13/17 0147   06/13/17 0030  vancomycin (VANCOCIN) IVPB 1000 mg/200 mL premix     1,000 mg 200 mL/hr over 60 Minutes Intravenous  Once 06/13/17 0027 06/13/17 0216     Subjective: Seen and examined this morning was having chills was tachypneic and tachycardic.  Was getting echo done this morning and states that she felt bad all over.  States she has been injecting all over her body as well.  No chest pain, nausea, vomiting.  No other complaints or concerns however felt weak with chills.  No other complaints at this time  Objective: Vitals:   06/13/17 0638 06/13/17 1305 06/13/17 2145 06/14/17 0353  BP: (!) 99/59 92/65 109/76 95/67  Pulse: 93 66 95 85  Resp: Temp: 98.5 F (36.9 C) 98 F (36.7 C)  99.1 F (37.3 C) 97.8 F (36.6 C)  TempSrc: Oral Oral Axillary Oral  SpO2: 99% 98% 100% 99%  Weight:      Height:        Intake/Output Summary (Last 24 hours) at 06/14/2017 0759 Last data filed at 06/14/2017 0600 Gross per 24 hour  Intake 910 ml  Output -  Net 910 ml   Filed Weights   06/12/17 2142 06/13/17 0635  Weight: 61.6 kg (135 lb 11.2 oz) 62.9 kg (138 lb 10.7 oz)   Examination: Physical Exam:  Constitutional: Thin Caucasian female appears calm but uncomfortable Eyes: Lids and conjunctivae normal, sclerae anicteric  ENMT: External Ears, Nose appear normal. Grossly normal hearing.  Neck: Appears normal, supple, no cervical masses, normal ROM, no appreciable thyromegaly; no JVD Respiratory: Diminished to  auscultation bilaterally, no wheezing, rales, rhonchi or crackles. Normal respiratory effort and patient is not tachypenic. No accessory muscle use.  Cardiovascular: Slightly Tachycardic rate, no murmurs / rubs / gallops. Has a Murmur.  2+ pedal pulses. No carotid bruits.  Abdomen: Soft, non-tender, non-distended. No masses palpated. No appreciable hepatosplenomegaly. Bowel sounds positive x4.  GU: Deferred. Musculoskeletal: No clubbing / cyanosis of digits/nails. No joint deformity upper and lower extremities. Skin: Has injection tracts and marks throughout the body with induration and abscesses on the Right Leg Neurologic: CN 2-12 grossly intact with no focal deficits. Romberg sign and cerebellar reflexes not assessed.  Psychiatric: Normal judgment and insight. Alert and oriented x 3. Depressed appearing mood and appropriate affect.   Data Reviewed: I have personally reviewed following labs and imaging studies  CBC: Recent Labs  Lab 06/12/17 2335 06/13/17 1640 06/14/17 0354  WBC 2.8* 4.7 4.6  NEUTROABS 2.4  --   --   HGB 12.5 11.8* 10.6*  HCT 35.9* 35.6* 31.8*  MCV 90.2 92.7 91.4  PLT 79* 52* 48*   Basic Metabolic Panel: Recent Labs  Lab 06/12/17 2335 06/13/17 1640 06/14/17 0354  NA 130*  --  134*  K 3.0*  --  3.0*  CL 96*  --  101  CO2 24  --  26  GLUCOSE 93  --  132*  BUN 19  --  15  CREATININE 1.21* 0.86 0.75  CALCIUM 8.2*  --  8.2*  MG  --  2.0  --    GFR: Estimated Creatinine Clearance: 84.1 mL/min (by C-G formula based on SCr of 0.75 mg/dL). Liver Function Tests: Recent Labs  Lab 06/12/17 2335  AST 30  ALT 20  ALKPHOS 106  BILITOT 1.3*  PROT 6.5  ALBUMIN 2.8*   No results for input(s): LIPASE, AMYLASE in the last 168 hours. No results for input(s): AMMONIA in the last 168 hours. Coagulation Profile: No results for input(s): INR, PROTIME in the last 168 hours. Cardiac Enzymes: Recent Labs  Lab 06/12/17 2335  TROPONINI <0.03   BNP (last 3  results) No results for input(s): PROBNP in the last 8760 hours. HbA1C: No results for input(s): HGBA1C in the last 72 hours. CBG: No results for input(s): GLUCAP in the last 168 hours. Lipid Profile: No results for input(s): CHOL, HDL, LDLCALC, TRIG, CHOLHDL, LDLDIRECT in the last 72 hours. Thyroid Function Tests: No results for input(s): TSH, T4TOTAL, FREET4, T3FREE, THYROIDAB in the last 72 hours. Anemia Panel: No results for input(s): VITAMINB12, FOLATE, FERRITIN, TIBC, IRON, RETICCTPCT in the last 72 hours. Sepsis Labs: Recent Labs  Lab 06/12/17 2349  LATICACIDVEN 1.69    Recent Results (from the past 240 hour(s))  Blood Culture (routine x 2)     Status: None (Preliminary result)   Collection Time: 06/12/17 11:36 PM  Result Value Ref Range Status   Specimen Description   Final    BLOOD FEMORAL ARTERY LEFT Performed at [facility], [address] [place], [place] [address]    Special Requests   Final    BOTTLES DRAWN AEROBIC AND ANAEROBIC Blood Culture adequate volume Performed at [facility], [address] [place], [place] [address]    Culture  Setup Time   Final    GRAM POSITIVE COCCI IN BOTH AEROBIC AND ANAEROBIC BOTTLES CRITICAL RESULT CALLED TO, READ BACK BY AND VERIFIED WITH: Anette Riedel GREEN [id number] MLM Performed at [facility], [address], [place], [place] [address]    Culture GRAM POSITIVE COCCI  Final   Report Status PENDING  Incomplete  Blood Culture ID Panel (Reflexed)     Status: Abnormal   Collection Time: 06/12/17 11:36 PM  Result Value Ref Range Status   Enterococcus species NOT DETECTED NOT DETECTED Final   Listeria monocytogenes NOT DETECTED NOT DETECTED Final   Staphylococcus species DETECTED (A) NOT DETECTED Final    Comment: CRITICAL RESULT CALLED TO, READ BACK BY AND VERIFIED WITH: PHARMD T GREEN [id number] MLM    Staphylococcus aureus DETECTED (A) NOT DETECTED Final    Comment: Methicillin  (oxacillin)-resistant Staphylococcus aureus (MRSA). MRSA is predictably resistant to beta-lactam antibiotics (except ceftaroline). Preferred therapy is vancomycin unless clinically contraindicated. Patient requires contact precautions if  hospitalized. CRITICAL RESULT CALLED TO, READ BACK BY AND VERIFIED WITH: PHARMD T GREEN [id number] MLM    Methicillin resistance DETECTED (A) NOT DETECTED Final    Comment: CRITICAL RESULT CALLED TO, READ BACK BY AND VERIFIED WITH: PHARMD T GREEN [id number] MLM    Streptococcus species NOT DETECTED NOT DETECTED Final   Streptococcus agalactiae NOT DETECTED NOT DETECTED Final   Streptococcus pneumoniae NOT DETECTED NOT DETECTED Final   Streptococcus pyogenes NOT DETECTED NOT DETECTED Final   Acinetobacter baumannii NOT DETECTED NOT DETECTED Final   Enterobacteriaceae species NOT DETECTED NOT DETECTED Final   Enterobacter cloacae complex NOT DETECTED NOT DETECTED Final   Escherichia coli NOT DETECTED NOT DETECTED Final   Klebsiella oxytoca NOT DETECTED NOT DETECTED Final   Klebsiella pneumoniae NOT DETECTED NOT DETECTED Final   Proteus species NOT DETECTED NOT DETECTED Final   Serratia marcescens NOT DETECTED NOT DETECTED Final   Haemophilus influenzae NOT DETECTED NOT DETECTED Final   Neisseria meningitidis NOT DETECTED NOT DETECTED Final   Pseudomonas aeruginosa NOT DETECTED NOT DETECTED Final   Candida albicans NOT DETECTED NOT DETECTED Final   Candida glabrata NOT DETECTED NOT DETECTED Final   Candida krusei NOT DETECTED NOT DETECTED Final   Candida parapsilosis NOT DETECTED NOT DETECTED Final   Candida tropicalis NOT DETECTED NOT DETECTED Final    Comment: Performed at [facility], [address] [place], [place] [address]  Blood Culture (routine x 2)     Status: None (Preliminary result)   Collection Time: 06/13/17 12:30 AM  Result Value Ref Range Status   Specimen Description   Final    BLOOD LEFT ARM UPPER Performed at [facility], [address] [place], [place] [address]    Special Requests   Final    BOTTLES DRAWN AEROBIC AND ANAEROBIC Blood Culture adequate volume Performed at [facility], [address] [place], [place] [address]    Culture  Setup Time   Final    GRAM POSITIVE COCCI IN BOTH AEROBIC AND ANAEROBIC BOTTLES Performed at [facility], [address] [place], [place] [address]    Culture GRAM POSITIVE COCCI  Final   Report Status PENDING  Incomplete    Radiology Studies: Dg Chest 2 View  Result Date: 06/12/2017 CLINICAL DATA:  Femur abscess, history of heart surgery EXAM: CHEST - 2 VIEW COMPARISON:  12/20/2015 FINDINGS: Post sternotomy changes with valve prosthesis. No acute opacity or pleural effusion. Normal heart size. No pneumothorax. IMPRESSION: No active cardiopulmonary disease. Electronically Signed   By: Jasmine Pang M.D.   On: 06/12/2017 22:35   Dg Chest Port 1 View  Result Date: 06/13/2017 CLINICAL DATA:  42 y/o female inpatient, encounter for central line placement. EXAM: PORTABLE CHEST - 1 VIEW COMPARISON:  06/12/2017 FINDINGS: Right IJ central line to the lower SVC. No pneumothorax. Lungs are clear. Heart size normal.  Previous median sternotomy and valve surgery. No effusion. Visualized bones unremarkable. IMPRESSION: 1. Central line to lower SVC.  No pneumothorax. Electronically Signed   By: Corlis Leak M.D.   On: 06/13/2017 15:14   Dg Femur, Min 2 Views Right  Result Date: 06/12/2017 CLINICAL DATA:  Right femur abscess near medial distal aspect of the anterior femur EXAM: RIGHT FEMUR 2 VIEWS COMPARISON:  None. FINDINGS: No acute fracture or malalignment. No periostitis or bone destruction. Soft tissue swelling distal thigh. IMPRESSION: No acute osseous abnormality Electronically Signed   By: Jasmine Pang M.D.   On: 06/12/2017 22:34   [place] Ekg Site Rite  Result Date: 06/13/2017 If Site Rite image not attached, placement could not be confirmed due to  current cardiac rhythm.  Scheduled Meds: . buPROPion  300 mg Oral Daily  . enoxaparin (LOVENOX) injection  40 mg Subcutaneous Daily  . methadone  60 mg Oral Daily  . QUEtiapine  50 mg Oral QHS  . sodium chloride flush  10-40 mL Intracatheter Q12H   Continuous Infusions: . rifampin (RIFADIN) IVPB Stopped (06/14/17 0981)  . vancomycin Stopped (06/13/17 2330)    LOS: 1 day   Merlene Laughter, DO [facility] Pager [phone]  If 7PM-7AM, please contact night-coverage [email or web address] Password [facility] 06/14/2017, 7:59 AM

## 2017-06-14 NOTE — Consult Note (Signed)
Date of Admission:  06/12/2017          Reason for Consult: MRSA bacteremia and patient with bioprosthetic valve and active IV drug use    Referring Provider: Connye Burkitt   Assessment:  1. MRSA bacteremia 2. 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 Relapsed IV drug use times several months with injection into thigh now with 4. Thigh abscess 5. Chronic hepatitis C without hepatic coma  Plan:  1. Narrow to vancomycin alone 2. Repeat blood cultures though they will need to be repeated again once she has a "catheter holiday 3. Central line was placed during admission will need to be removed at some point.  We may need to consider a maneuvers such as giving a long-acting drug such as IV ORITAVANCIN vs Dalbavancin to facilitate this vs oral drug such as zyvox that has high bioavailability 4. Patient will need transesophageal echocardiogram 5. If she has endocarditis of her prosthetic valve we will need to use rifampin in addition to vancomycin and she will also need 2 weeks of gentamicin 6. Her thigh does need to be imaged I have ordered a CT scan with contrast because she says she does not think she can hold still for an MRI 7. She will need Orthopedics once we have clarity on her absscess +/- pyomyositis  Principal Problem:   Abscess of right leg Active Problems:   Wound infection   H/O bacterial endocarditis   Heroin abuse (HCC)   Hepatitis C   Bipolar 2 disorder (HCC)   Hypokalemia   Scheduled Meds: . buPROPion  300 mg Oral Daily  . enoxaparin (LOVENOX) injection  40 mg Subcutaneous Daily  . methadone  60 mg Oral Daily  . QUEtiapine  50 mg Oral QHS  . sodium chloride flush  10-40 mL Intracatheter Q12H   Continuous Infusions: . sodium chloride 100 mL/hr at 06/14/17 1040  . rifampin (RIFADIN) IVPB Stopped (06/14/17 1610)  . vancomycin Stopped (06/13/17 2330)   PRN Meds:.acetaminophen **OR** acetaminophen, ibuprofen, ondansetron  **OR** ondansetron (ZOFRAN) IV, sodium chloride flush  HPI: Gloria Meyer is a 42 y.o. female    Review of Systems: Review of Systems  Constitutional: Positive for chills, fever and malaise/fatigue. Negative for weight loss.  HENT: Negative for congestion, hearing loss, sore throat and tinnitus.   Eyes: Negative for blurred vision and double vision.  Respiratory: Positive for cough. Negative for sputum production, shortness of breath and wheezing.   Cardiovascular: Positive for chest pain, palpitations and leg swelling.  Gastrointestinal: Negative for abdominal pain, blood in stool, constipation, diarrhea, heartburn, melena, nausea and vomiting.  Genitourinary: Negative for dysuria, flank pain and hematuria.  Musculoskeletal: Positive for joint pain and myalgias. Negative for back pain and falls.  Skin: Negative for itching and rash.  Neurological: Positive for dizziness and headaches. Negative for sensory change, focal weakness, loss of consciousness and weakness.  Endo/Heme/Allergies: Does not bruise/bleed easily.  Psychiatric/Behavioral: Positive for depression and substance abuse. Negative for memory loss and suicidal ideas. The patient is nervous/anxious and has insomnia.     Past Medical History:  Diagnosis Date  . Arthritis   . Bipolar 2 disorder (HCC)   . Drug abuse (HCC)    last use 11/17  . Hepatitis   . Juvenile arthritis (HCC)   . Pneumonia   . PTSD (post-traumatic stress disorder)   . Schizophrenia (HCC)   . Seizures (HCC)     Social History   Tobacco Use  .  Smoking status: Current Every Day Smoker    Types: Cigarettes  . Smokeless tobacco: Never Used  Substance Use Topics  . Alcohol use: Yes    Comment: occasional beer  . Drug use: Yes    Types: IV    Family History  Problem Relation Age of Onset  . Diabetes Father    Allergies  Allergen Reactions  . Sulfa Antibiotics Rash    OBJECTIVE: Blood pressure 128/72, pulse (!) 112, temperature (!) 97.4  F (36.3 C), temperature source Oral, resp. rate (!) 22, height  (1.651 m), weight 138 lb 10.7 oz (62.9 kg), SpO2 99 %.  Physical Exam  Constitutional: She is oriented to person, place, and time. No distress.  HENT:  Head: Normocephalic and atraumatic.  Right Ear: External ear normal.  Left Ear: External ear normal.  Nose: Nose normal.  Mouth/Throat: Oropharynx is clear and moist.  Eyes: Conjunctivae and EOM are normal. No scleral icterus.  Neck: Normal range of motion. Neck supple.  Cardiovascular: Normal rate and regular rhythm. Exam reveals no gallop and no friction rub.  No murmur heard. It was difficult to auscultate murmurs due to the patient moaning from her Reiger's  Pulmonary/Chest: Breath sounds normal. Tachypnea noted. No respiratory distress. She has no wheezes. She has no rales.  Lungs are relatively clear she was tachypneic and also groaning and shivering due to her rigors  Abdominal: Soft. Bowel sounds are normal. She exhibits no distension. There is no tenderness. There is no rebound.  Musculoskeletal: Normal range of motion. She exhibits edema and tenderness.  Lymphadenopathy:    She has no cervical adenopathy.  Neurological: She is alert and oriented to person, place, and time. Coordination normal.  Skin: Skin is warm and dry. No rash noted. She is not diaphoretic. No erythema. No pallor.  Psychiatric: Judgment normal.    Right thigh:\with painful area and undoubtedly an abscess underneath        Lab Results Lab Results  Component Value Date   WBC 4.6 06/14/2017   HGB 10.6 (L) 06/14/2017   HCT 31.8 (L) 06/14/2017   MCV 91.4 06/14/2017   PLT 48 (L) 06/14/2017    Lab Results  Component Value Date   CREATININE 0.75 06/14/2017   BUN 15 06/14/2017   NA 134 (L) 06/14/2017   K 3.0 (L) 06/14/2017   CL 101 06/14/2017   CO2 26 06/14/2017    Lab Results  Component Value Date   ALT 20 06/12/2017   AST 30 06/12/2017   ALKPHOS 106 06/12/2017    BILITOT 1.3 (H) 06/12/2017     Microbiology: Recent Results (from the past 240 hour(s))  Blood Culture (routine x 2)     Status: Abnormal (Preliminary result)   Collection Time: 06/12/17 11:36 PM  Result Value Ref Range Status   Specimen Description   Final    BLOOD FEMORAL ARTERY LEFT Performed at [facility], [address] [place], [place] [address]    Special Requests   Final    BOTTLES DRAWN AEROBIC AND ANAEROBIC Blood Culture adequate volume Performed at [facility], [address] [place], [place] [address]    Culture  Setup Time   Final    GRAM POSITIVE COCCI IN BOTH AEROBIC AND ANAEROBIC BOTTLES CRITICAL RESULT CALLED TO, READ BACK BY AND VERIFIED WITH: PHARMD T GREEN [id number] MLM    Culture (A)  Final    STAPHYLOCOCCUS AUREUS SUSCEPTIBILITIES TO FOLLOW Performed at [facility], [address] [place], [place] [address]    Report Status PENDING  Incomplete  Blood Culture ID Panel (Reflexed)     Status: Abnormal   Collection Time: 06/12/17 11:36 PM  Result Value Ref Range Status   Enterococcus species NOT DETECTED NOT DETECTED Final   Listeria monocytogenes NOT DETECTED NOT DETECTED Final   Staphylococcus species DETECTED (A) NOT DETECTED Final    Comment: CRITICAL RESULT CALLED TO, READ BACK BY AND VERIFIED WITH: PHARMD T GREEN [id number] MLM    Staphylococcus aureus DETECTED (A) NOT DETECTED Final    Comment: Methicillin (oxacillin)-resistant Staphylococcus aureus (MRSA). MRSA is predictably resistant to beta-lactam antibiotics (except ceftaroline). Preferred therapy is vancomycin unless clinically contraindicated. Patient requires contact precautions if  hospitalized. CRITICAL RESULT CALLED TO, READ BACK BY AND VERIFIED WITH: PHARMD T GREEN [id number] MLM    Methicillin resistance DETECTED (A) NOT DETECTED Final    Comment: CRITICAL RESULT CALLED TO, READ BACK BY AND VERIFIED WITH: PHARMD T GREEN [id number] MLM     Streptococcus species NOT DETECTED NOT DETECTED Final   Streptococcus agalactiae NOT DETECTED NOT DETECTED Final   Streptococcus pneumoniae NOT DETECTED NOT DETECTED Final   Streptococcus pyogenes NOT DETECTED NOT DETECTED Final   Acinetobacter baumannii NOT DETECTED NOT DETECTED Final   Enterobacteriaceae species NOT DETECTED NOT DETECTED Final   Enterobacter cloacae complex NOT DETECTED NOT DETECTED Final   Escherichia coli NOT DETECTED NOT DETECTED Final   Klebsiella oxytoca NOT DETECTED NOT DETECTED Final   Klebsiella pneumoniae NOT DETECTED NOT DETECTED Final   Proteus species NOT DETECTED NOT DETECTED Final   Serratia marcescens NOT DETECTED NOT DETECTED Final   Haemophilus influenzae NOT DETECTED NOT DETECTED Final   Neisseria meningitidis NOT DETECTED NOT DETECTED Final   Pseudomonas aeruginosa NOT DETECTED NOT DETECTED Final   Candida albicans NOT DETECTED NOT DETECTED Final   Candida glabrata NOT DETECTED NOT DETECTED Final   Candida krusei NOT DETECTED NOT DETECTED Final   Candida parapsilosis NOT DETECTED NOT DETECTED Final   Candida tropicalis NOT DETECTED NOT DETECTED Final    Comment: Performed at [facility], [address] [place], [place] [address]  Blood Culture (routine x 2)     Status: Abnormal (Preliminary result)   Collection Time: 06/13/17 12:30 AM  Result Value Ref Range Status   Specimen Description   Final    BLOOD LEFT ARM UPPER Performed at [facility], [address] [place], [place] [address]    Special Requests   Final    BOTTLES DRAWN AEROBIC AND ANAEROBIC Blood Culture adequate volume Performed at [facility], [address] [place], [place] [address]    Culture  Setup Time   Final    GRAM POSITIVE COCCI IN BOTH AEROBIC AND ANAEROBIC BOTTLES Performed at [facility], [address], [place], [place] [address]    Culture STAPHYLOCOCCUS AUREUS (A)  Final   Report Status PENDING  Incomplete  Culture, blood  (Routine X 2) w Reflex to ID Panel     Status: None (Preliminary result)   Collection Time: 06/13/17  7:10 PM  Result Value Ref Range Status   Specimen Description   Final    BLOOD RIGHT FOREARM Performed at [facility], [address], [place], [place] [address]    Special Requests   Final    BOTTLES DRAWN AEROBIC ONLY Blood Culture results may not be optimal due  to an inadequate volume of blood received in culture bottles Performed at [facility], [address], [place], [place] [address]    Culture PENDING  Incomplete   Report Status PENDING  Incomplete    Acey Lav, MD [facility] [phone] pager  06/14/2017, 2:08 PM

## 2017-06-15 ENCOUNTER — Encounter (HOSPITAL_COMMUNITY): Payer: Self-pay

## 2017-06-15 ENCOUNTER — Inpatient Hospital Stay (HOSPITAL_COMMUNITY): Payer: Self-pay

## 2017-06-15 DIAGNOSIS — F1110 Opioid abuse, uncomplicated: Secondary | ICD-10-CM

## 2017-06-15 DIAGNOSIS — M79674 Pain in right toe(s): Secondary | ICD-10-CM

## 2017-06-15 DIAGNOSIS — M25562 Pain in left knee: Secondary | ICD-10-CM

## 2017-06-15 DIAGNOSIS — M25561 Pain in right knee: Secondary | ICD-10-CM

## 2017-06-15 DIAGNOSIS — Z952 Presence of prosthetic heart valve: Secondary | ICD-10-CM

## 2017-06-15 DIAGNOSIS — M25552 Pain in left hip: Secondary | ICD-10-CM

## 2017-06-15 LAB — COMPREHENSIVE METABOLIC PANEL
ALT: 13 U/L — AB (ref 14–54)
ANION GAP: 9 (ref 5–15)
AST: 15 U/L (ref 15–41)
Albumin: 2.1 g/dL — ABNORMAL LOW (ref 3.5–5.0)
Alkaline Phosphatase: 73 U/L (ref 38–126)
BUN: 9 mg/dL (ref 6–20)
CHLORIDE: 104 mmol/L (ref 101–111)
CO2: 23 mmol/L (ref 22–32)
CREATININE: 0.8 mg/dL (ref 0.44–1.00)
Calcium: 8 mg/dL — ABNORMAL LOW (ref 8.9–10.3)
Glucose, Bld: 118 mg/dL — ABNORMAL HIGH (ref 65–99)
Potassium: 3.2 mmol/L — ABNORMAL LOW (ref 3.5–5.1)
SODIUM: 136 mmol/L (ref 135–145)
Total Bilirubin: 0.8 mg/dL (ref 0.3–1.2)
Total Protein: 5.6 g/dL — ABNORMAL LOW (ref 6.5–8.1)

## 2017-06-15 LAB — CBC WITH DIFFERENTIAL/PLATELET
BASOS PCT: 1 %
Basophils Absolute: 0 10*3/uL (ref 0.0–0.1)
EOS ABS: 0 10*3/uL (ref 0.0–0.7)
EOS PCT: 1 %
HCT: 30.5 % — ABNORMAL LOW (ref 36.0–46.0)
Hemoglobin: 10.1 g/dL — ABNORMAL LOW (ref 12.0–15.0)
LYMPHS ABS: 0.8 10*3/uL (ref 0.7–4.0)
Lymphocytes Relative: 20 %
MCH: 30.6 pg (ref 26.0–34.0)
MCHC: 33.1 g/dL (ref 30.0–36.0)
MCV: 92.4 fL (ref 78.0–100.0)
MONO ABS: 0.6 10*3/uL (ref 0.1–1.0)
Monocytes Relative: 15 %
NEUTROS PCT: 63 %
Neutro Abs: 2.7 10*3/uL (ref 1.7–7.7)
PLATELETS: 56 10*3/uL — AB (ref 150–400)
RBC: 3.3 MIL/uL — ABNORMAL LOW (ref 3.87–5.11)
RDW: 15.1 % (ref 11.5–15.5)
WBC: 4.1 10*3/uL (ref 4.0–10.5)

## 2017-06-15 LAB — HCV RNA QUANT
HCV QUANT: 2430000 [IU]/mL (ref >50)
HCV Quantitative Log: 6.386 log10 IU/mL (ref >1.70)

## 2017-06-15 LAB — PHOSPHORUS: PHOSPHORUS: 4.2 mg/dL (ref 2.5–4.6)

## 2017-06-15 LAB — CULTURE, BLOOD (ROUTINE X 2)
Special Requests: ADEQUATE
Special Requests: ADEQUATE

## 2017-06-15 LAB — HEPATITIS C ANTIBODY

## 2017-06-15 LAB — HIV ANTIBODY (ROUTINE TESTING W REFLEX): HIV SCREEN 4TH GENERATION: NONREACTIVE

## 2017-06-15 LAB — MAGNESIUM: MAGNESIUM: 1.8 mg/dL (ref 1.7–2.4)

## 2017-06-15 LAB — MRSA PCR SCREENING: MRSA by PCR: NEGATIVE

## 2017-06-15 LAB — SEDIMENTATION RATE: SED RATE: 28 mm/h — AB (ref 0–22)

## 2017-06-15 LAB — PROCALCITONIN: Procalcitonin: 7.26 ng/mL

## 2017-06-15 LAB — C-REACTIVE PROTEIN: CRP: 13.6 mg/dL — ABNORMAL HIGH (ref <1.0)

## 2017-06-15 MED ORDER — FLUCONAZOLE 150 MG PO TABS
150.0000 mg | ORAL_TABLET | Freq: Once | ORAL | Status: AC
  Administered 2017-06-15: 150 mg via ORAL
  Filled 2017-06-15: qty 1

## 2017-06-15 MED ORDER — IOPAMIDOL (ISOVUE-300) INJECTION 61%
100.0000 mL | Freq: Once | INTRAVENOUS | Status: AC | PRN
  Administered 2017-06-15: 100 mL via INTRAVENOUS

## 2017-06-15 MED ORDER — ADULT MULTIVITAMIN W/MINERALS CH
1.0000 | ORAL_TABLET | Freq: Every day | ORAL | Status: DC
  Administered 2017-06-15 – 2017-06-24 (×9): 1 via ORAL
  Filled 2017-06-15 (×10): qty 1

## 2017-06-15 MED ORDER — POTASSIUM CHLORIDE CRYS ER 20 MEQ PO TBCR: 40.0000 meq | EXTENDED_RELEASE_TABLET | Freq: Two times a day (BID) | ORAL | Status: DC

## 2017-06-15 MED ORDER — IOPAMIDOL (ISOVUE-300) INJECTION 61%
INTRAVENOUS | Status: AC
  Filled 2017-06-15: qty 100

## 2017-06-15 MED ORDER — POTASSIUM CHLORIDE CRYS ER 20 MEQ PO TBCR
40.0000 meq | EXTENDED_RELEASE_TABLET | Freq: Two times a day (BID) | ORAL | Status: AC
  Administered 2017-06-15 – 2017-06-16 (×2): 40 meq via ORAL
  Filled 2017-06-15 (×2): qty 2

## 2017-06-15 MED ORDER — SERTRALINE HCL 50 MG PO TABS
50.0000 mg | ORAL_TABLET | Freq: Every day | ORAL | Status: DC
Start: 2017-06-15 — End: 2017-06-24
  Administered 2017-06-15 – 2017-06-24 (×10): 50 mg via ORAL
  Filled 2017-06-15 (×10): qty 1

## 2017-06-15 MED ORDER — DIVALPROEX SODIUM 125 MG PO DR TAB
125.0000 mg | DELAYED_RELEASE_TABLET | Freq: Every day | ORAL | Status: DC
  Administered 2017-06-15 – 2017-06-23 (×9): 125 mg via ORAL
  Filled 2017-06-15 (×9): qty 1

## 2017-06-15 MED ORDER — ENSURE ENLIVE PO LIQD
237.0000 mL | Freq: Three times a day (TID) | ORAL | Status: DC
  Administered 2017-06-15 – 2017-06-24 (×17): 237 mL via ORAL

## 2017-06-15 MED ORDER — VANCOMYCIN HCL IN DEXTROSE 750-5 MG/150ML-% IV SOLN
750.0000 mg | Freq: Two times a day (BID) | INTRAVENOUS | Status: DC
  Administered 2017-06-15 – 2017-06-18 (×7): 750 mg via INTRAVENOUS
  Filled 2017-06-15 (×9): qty 150

## 2017-06-15 NOTE — Progress Notes (Signed)
Patient is scheduled for TEE on 5.29.19 @ 1400 at [facility]. Spoke with Addison Naegeli, RN to arrange [facility] to arrive @ [facility] @ 1230.

## 2017-06-15 NOTE — Progress Notes (Signed)
    [facility] has been requested to perform a transesophageal echocardiogram on Gloria Meyer for .  After careful review of history and examination, the risks and benefits of transesophageal echocardiogram have been explained including risks of esophageal damage, perforation (1:10,000 risk), bleeding, pharyngeal hematoma as well as other potential complications associated with conscious sedation including aspiration, arrhythmia, respiratory failure and death. Alternatives to treatment were discussed, questions were answered. Patient is willing to proceed.  TEE - Dr. Elease Hashimoto 06/16/17 @ 1400 . NPO after midnight. Meds with sips.   [employer], PA-C 06/15/2017 10:00 AM

## 2017-06-15 NOTE — Progress Notes (Signed)
Subjective: Pt feels much better but co of left hip/groin pain, right big toe pain and knee pain   Antibiotics:  Anti-infectives (From admission, onward)   Start     Dose/Rate Route Frequency Ordered Stop   06/15/17 1100  fluconazole (DIFLUCAN) tablet 150 mg     150 mg Oral  Once 06/15/17 0952 06/15/17 1010   06/15/17 1000  vancomycin (VANCOCIN) IVPB 750 mg/150 ml premix     750 mg 150 mL/hr over 60 Minutes Intravenous Every 12 hours 06/15/17 0749     06/13/17 2200  vancomycin (VANCOCIN) IVPB 1000 mg/200 mL premix  Status:  Discontinued     1,000 mg 200 mL/hr over 60 Minutes Intravenous Every 24 hours 06/13/17 0852 06/15/17 0749   06/13/17 0830  piperacillin-tazobactam (ZOSYN) IVPB 3.375 g  Status:  Discontinued     3.375 g 12.5 mL/hr over 240 Minutes Intravenous Every 8 hours 06/13/17 0823 06/13/17 1555   06/13/17 0030  rifampin (RIFADIN) 300 mg in sodium chloride 0.9 % 100 mL IVPB  Status:  Discontinued     300 mg 200 mL/hr over 30 Minutes Intravenous Every 8 hours 06/13/17 0017 06/14/17 1416   06/13/17 0030  piperacillin-tazobactam (ZOSYN) IVPB 3.375 g     3.375 g 100 mL/hr over 30 Minutes Intravenous  Once 06/13/17 0017 06/13/17 0147   06/13/17 0030  vancomycin (VANCOCIN) IVPB 1000 mg/200 mL premix     1,000 mg 200 mL/hr over 60 Minutes Intravenous  Once 06/13/17 0027 06/13/17 0216      Medications: Scheduled Meds: . buPROPion  300 mg Oral Daily  . clotrimazole  1 Applicatorful Vaginal QHS  . divalproex  125 mg Oral QHS  . enoxaparin (LOVENOX) injection  40 mg Subcutaneous Daily  . iopamidol      . methadone  60 mg Oral Daily  . QUEtiapine  50 mg Oral QHS  . sertraline  50 mg Oral Daily  . sodium chloride flush  10-40 mL Intracatheter Q12H   Continuous Infusions: . sodium chloride 100 mL/hr at 06/15/17 0846  . vancomycin 750 mg (06/15/17 0900)   PRN Meds:.acetaminophen **OR** acetaminophen, ibuprofen, ondansetron **OR** ondansetron (ZOFRAN) IV, sodium  chloride flush    Objective: Weight change:   Intake/Output Summary (Last 24 hours) at 06/15/2017 1015 Last data filed at 06/15/2017 0600 Gross per 24 hour  Intake 3105 ml  Output 2 ml  Net 3103 ml   Blood pressure 102/63, pulse 83, temperature 98.1 F (36.7 C), temperature source Oral, resp. rate 16, height  (1.651 m), weight 138 lb 10.7 oz (62.9 kg), SpO2 100 %. Temp:  [98.1 F (36.7 C)-99.6 F (37.6 C)] 98.1 F (36.7 C) (05/28 0604) Pulse Rate:  [78-92] 83 (05/28 0604) Resp:  [16-20] 16 (05/28 0604) BP: (90-110)/(58-75) 102/63 (05/28 0604) SpO2:  [97 %-100 %] 100 % (05/28 0604)  Physical Exam: General: Alert and awake, oriented x3, not in any acute distress. HEENT: anicteric sclera, EOMI CVS regular rate, normal  No mgr heard Chest: , no wheezing, no respiratory distress, lungs clear anteriorly Abdomen: soft non-distended,  Extremities: she has tenderness in thigh right side and. Her knee is minilally tender but not much effusion, similar for left knee. Right big toe minimally tender  She DOES have pain in groin with internal and external rotation of hip joint Neuro: nonfocal  CBC:    BMET Recent Labs    06/12/17 2335 06/13/17 1640 06/14/17 0354  NA 130*  --  134*  K 3.0*  --  3.0*  CL 96*  --  101  CO2 24  --  26  GLUCOSE 93  --  132*  BUN 19  --  15  CREATININE 1.21* 0.86 0.75  CALCIUM 8.2*  --  8.2*     Liver Panel  Recent Labs    06/12/17 2335  PROT 6.5  ALBUMIN 2.8*  AST 30  ALT 20  ALKPHOS 106  BILITOT 1.3*       Sedimentation Rate Recent Labs    06/15/17 0425  ESRSEDRATE 28*   C-Reactive Protein Recent Labs    06/15/17 0427  CRP 13.6*    Micro Results: Recent Results (from the past 720 hour(s))  Blood Culture (routine x 2)     Status: Abnormal   Collection Time: 06/12/17 11:36 PM  Result Value Ref Range Status   Specimen Description   Final    BLOOD FEMORAL ARTERY LEFT Performed at [facility], [address] [place], [place] [address]    Special Requests   Final    BOTTLES DRAWN AEROBIC AND ANAEROBIC Blood Culture adequate volume Performed at [facility], [address] [place], [place] [address]    Culture  Setup Time   Final    GRAM POSITIVE COCCI IN BOTH AEROBIC AND ANAEROBIC BOTTLES CRITICAL RESULT CALLED TO, READ BACK BY AND VERIFIED WITH: Anette Riedel GREEN [id number] MLM Performed at [facility], [address], [place], [place] [address]    Culture METHICILLIN RESISTANT STAPHYLOCOCCUS AUREUS (A)  Final   Report Status 06/15/2017 FINAL  Final   Organism ID, Bacteria METHICILLIN RESISTANT STAPHYLOCOCCUS AUREUS  Final      Susceptibility   Methicillin resistant staphylococcus aureus - MIC*    CIPROFLOXACIN >=8 RESISTANT Resistant     ERYTHROMYCIN >=8 RESISTANT Resistant     GENTAMICIN <=0.5 SENSITIVE Sensitive     OXACILLIN >=4 RESISTANT Resistant     TETRACYCLINE <=1 SENSITIVE Sensitive     VANCOMYCIN 1 SENSITIVE Sensitive     TRIMETH/SULFA 160 RESISTANT Resistant     CLINDAMYCIN >=8 RESISTANT Resistant     RIFAMPIN <=0.5 SENSITIVE Sensitive     Inducible Clindamycin NEGATIVE Sensitive     * METHICILLIN RESISTANT STAPHYLOCOCCUS AUREUS  Blood Culture ID Panel (Reflexed)     Status: Abnormal   Collection Time: 06/12/17 11:36 PM  Result Value Ref Range Status   Enterococcus species NOT DETECTED NOT DETECTED Final   Listeria monocytogenes NOT DETECTED NOT DETECTED Final   Staphylococcus species DETECTED (A) NOT DETECTED Final    Comment: CRITICAL RESULT CALLED TO, READ BACK BY AND VERIFIED WITH: PHARMD T GREEN [id number] MLM    Staphylococcus aureus DETECTED (A) NOT DETECTED Final    Comment: Methicillin (oxacillin)-resistant Staphylococcus aureus (MRSA). MRSA is predictably resistant to beta-lactam antibiotics (except ceftaroline). Preferred therapy is vancomycin unless clinically contraindicated. Patient requires contact precautions if    hospitalized. CRITICAL RESULT CALLED TO, READ BACK BY AND VERIFIED WITH: PHARMD T GREEN [id number] MLM    Methicillin resistance DETECTED (A) NOT DETECTED Final    Comment: CRITICAL RESULT CALLED TO, READ BACK BY AND VERIFIED WITH: PHARMD T GREEN [id number] MLM    Streptococcus species NOT DETECTED NOT DETECTED Final   Streptococcus agalactiae NOT DETECTED NOT DETECTED Final   Streptococcus pneumoniae NOT DETECTED NOT DETECTED Final   Streptococcus pyogenes NOT DETECTED NOT DETECTED Final   Acinetobacter baumannii NOT DETECTED  NOT DETECTED Final   Enterobacteriaceae species NOT DETECTED NOT DETECTED Final   Enterobacter cloacae complex NOT DETECTED NOT DETECTED Final   Escherichia coli NOT DETECTED NOT DETECTED Final   Klebsiella oxytoca NOT DETECTED NOT DETECTED Final   Klebsiella pneumoniae NOT DETECTED NOT DETECTED Final   Proteus species NOT DETECTED NOT DETECTED Final   Serratia marcescens NOT DETECTED NOT DETECTED Final   Haemophilus influenzae NOT DETECTED NOT DETECTED Final   Neisseria meningitidis NOT DETECTED NOT DETECTED Final   Pseudomonas aeruginosa NOT DETECTED NOT DETECTED Final   Candida albicans NOT DETECTED NOT DETECTED Final   Candida glabrata NOT DETECTED NOT DETECTED Final   Candida krusei NOT DETECTED NOT DETECTED Final   Candida parapsilosis NOT DETECTED NOT DETECTED Final   Candida tropicalis NOT DETECTED NOT DETECTED Final    Comment: Performed at [facility], [address] [place], [place] [address]  Blood Culture (routine x 2)     Status: Abnormal   Collection Time: 06/13/17 12:30 AM  Result Value Ref Range Status   Specimen Description   Final    BLOOD LEFT ARM UPPER Performed at [facility], [address] [place], [place] [address]    Special Requests   Final    BOTTLES DRAWN AEROBIC AND ANAEROBIC Blood Culture adequate volume Performed at [facility], [address] [place], [place] [address]    Culture   Setup Time   Final    GRAM POSITIVE COCCI IN BOTH AEROBIC AND ANAEROBIC BOTTLES    Culture (A)  Final    STAPHYLOCOCCUS AUREUS SUSCEPTIBILITIES PERFORMED ON PREVIOUS CULTURE WITHIN THE LAST 5 DAYS. Performed at [facility], [address] [place], [place] [address]    Report Status 06/15/2017 FINAL  Final  Culture, blood (Routine X 2) w Reflex to ID Panel     Status: None (Preliminary result)   Collection Time: 06/13/17  7:10 PM  Result Value Ref Range Status   Specimen Description   Final    BLOOD RIGHT FOREARM Performed at [facility], [address] [place], [place] [address]    Special Requests   Final    BOTTLES DRAWN AEROBIC ONLY Blood Culture results may not be optimal due to an inadequate volume of blood received in culture bottles Performed at [facility], [address], [place], [place] [address]    Culture   Final    NO GROWTH < 24 HOURS Performed at [facility], [address] [place], [place] [address]    Report Status PENDING  Incomplete    Studies/Results: Ct Femur Right W Contrast  Result Date: 06/15/2017 CLINICAL DATA:  Drug abuser with injections in the right thigh. Right thigh pain swelling for 2 weeks. Malaise, fatigue, fever and chills. EXAM: CT OF THE LOWER RIGHT EXTREMITY WITH CONTRAST TECHNIQUE: Multidetector CT imaging of the lower right extremity was performed according to the standard protocol following intravenous contrast administration. COMPARISON:  Plain films right upper leg 06/12/2017. CONTRAST:  100 ml ISOVUE-300 IOPAMIDOL (ISOVUE-300) INJECTION 61% FINDINGS: Bones/Joint/Cartilage Appear normal throughout without fracture, focal lesion, periosteal reaction or bony destructive change. No evidence of arthropathy about the hip or knee. Ligaments Suboptimally assessed by CT. Muscles and Tendons Intact and normal in appearance. Fat planes within muscle are preserved. No intramuscular abscess is identified. Soft  tissues Mild stranding in subcutaneous fat about the thigh is noted. No focal fluid collection is seen.  The patient has a trace knee joint effusion. Small Baker's cyst is noted. Imaged intrapelvic contents demonstrate a small amount of free pelvic fluid consistent with physiologic change. IMPRESSION: Mild stranding in subcutaneous fat about the right upper leg is compatible with cellulitis. Negative for abscess, myositis or osteomyelitis. Small Baker's cyst is incidentally noted. Electronically Signed   By: Drusilla Kanner M.D.   On: 06/15/2017 07:25   Dg Chest Port 1 View  Result Date: 06/13/2017 CLINICAL DATA:  42 y/o female inpatient, encounter for central line placement. EXAM: PORTABLE CHEST - 1 VIEW COMPARISON:  06/12/2017 FINDINGS: Right IJ central line to the lower SVC. No pneumothorax. Lungs are clear. Heart size normal.  Previous median sternotomy and valve surgery. No effusion. Visualized bones unremarkable. IMPRESSION: 1. Central line to lower SVC.  No pneumothorax. Electronically Signed   By: Corlis Leak M.D.   On: 06/13/2017 15:14      Assessment/Plan:  INTERVAL HISTORY: above complaints noted. CT of thigh without overt abscess  Principal Problem:   Abscess of right leg Active Problems:   Wound infection   H/O bacterial endocarditis   Heroin abuse (HCC)   Hepatitis C   Bipolar 2 disorder (HCC)   Hypokalemia    Bryna Razavi is a 42 y.o. female with  IVDU, Hep C, prosthetic TV (replaced at [facility]) now with MRSA bacteremia/rule out endocarditis from soft tissue infection at injection site in thigh  #1       [place] Antimicrobial Management Team Staphylococcus aureus bacteremia   Staphylococcus aureus bacteremia (SAB) is associated with a high rate of complications and mortality.  Specific aspects of clinical management are critical to optimizing the outcome of patients with SAB.  Therefore, the [facility] Antimicrobial Management Team [facility]) has initiated an  intervention aimed at improving the management of SAB at [facility].  To do so, Infectious Diseases physicians are providing an evidence-based consult for the management of all patients with SAB.     Yes No Comments  Perform follow-up blood cultures (even if the patient is afebrile) to ensure clearance of bacteremia [x]  []  REPEAT TODAY POST CVL REMOVAL  Remove vascular catheter and obtain follow-up blood cultures after the removal of the catheter [x]  []  Central line is out  Perform echocardiography to evaluate for endocarditis (transthoracic ECHO is 40-50% sensitive, TEE is > 90% sensitive) [x]  []  Please keep in mind, that neither test can definitively EXCLUDE endocarditis, and that should clinical suspicion remain high for endocarditis the patient should then still be treated with an "endocarditis" duration of therapy = 6 weeks  Consult electrophysiologist to evaluate implanted cardiac device (pacemaker, ICD) []  []    Ensure source control [x]  []  Have all abscesses been drained effectively? Have deep seeded infections (septic joints or osteomyelitis) had appropriate surgical debridement?  Not clear will have to observe thigh clinically  Investigate for "metastatic" sites of infection []  []  Does the patient have ANY symptom or physical exam finding that would suggest a deeper infection (back or neck pain that may be suggestive of vertebral osteomyelitis or epidural abscess, muscle pain that could be a symptom of pyomyositis)?  Keep in mind that for deep seeded infections MRI imaging with contrast is preferred rather than other often insensitive tests such as plain x-rays, especially early in a patient's presentation.  IF HIP pain persists, would get MRI  Of Left hip with GAD. Her knees and big toe are not esp tender  Change antibiotic therapy to vancomycin []  []  Beta-lactam  antibiotics are preferred for MSSA due to higher cure rates.   If on Vancomycin, goal trough should be 15 - 20 mcg/mL    Estimated duration of IV antibiotic therapy:  4-6 weeks depending upon sites found   Consult case management for probably prolonged outpatient IV antibiotic therapy   #2 IVDU: DrMarland Mcalpine found NEEDLE cap on bed when we were in the room. Monitor for IVDU in house as able  Needs to get back on treatment  #3 Hep C: would be happy to treat her as outpatient  I spent greater than 35 minutes with the patient including greater than 50% of time in face to face counsel of the patient and in coordination of  Her care with primary team and Cardiology.    LOS: 2 days   Acey Lav 06/15/2017, 10:15 AM

## 2017-06-15 NOTE — Progress Notes (Signed)
06/15/17  1410  [facility] called wanting patient transferred to [facility] by [facility] by 1230 on 06/16/17 for TEE scheduled at 2pm.

## 2017-06-15 NOTE — Progress Notes (Signed)
Pharmacy Antibiotic Note  Gloria Meyer is a 42 y.o. female admitted on 06/12/2017 with MRSA bacteremia and thigh abscess.  Patient has a hx of MRSA endocarditis .  Pharmacy has been consulted for vancomycin dosing. ID following.  Plan: Given improvement in renal function, will increase vancomycin dose to 750 mg IV q12h  Expected/calculated AUC = 465  Check vancomycin levels at steady state, goal AUC 400-500 Follow up renal function & cultures  Height:  (165.1 cm) Weight: 138 lb 10.7 oz (62.9 kg) IBW/kg (Calculated) : 57  Temp (24hrs), Avg:98.4 F (36.9 C), Min:97.4 F (36.3 C), Max:99.6 F (37.6 C)  Recent Labs  Lab 06/12/17 2335 06/12/17 2349 06/13/17 1640 06/14/17 0354 06/14/17 1017 06/14/17 1344  WBC 2.8*  --  4.7 4.6  --   --   CREATININE 1.21*  --  0.86 0.75  --   --   LATICACIDVEN  --  1.69  --   --  1.8 1.6    Estimated Creatinine Clearance: 84.1 mL/min (by C-G formula based on SCr of 0.75 mg/dL).    Allergies  Allergen Reactions  . Sulfa Antibiotics Rash    Antimicrobials this admission:  5/26 Vanc >> 5/26 Rifampin >> 5/27 5/26 Zosyn  Dose adjustments this admission:  5/28 Vancomycin 1 g IV q24h --> vancomycin 750 mg IV q12h  Microbiology results:  5/25 BCx: MRSA 5/26 BCx: Ngtd  Thank you for allowing pharmacy to be a part of this patient's care.  Keturah Barre, PharmD, BCPS Clinical Pharmacist Pager: [phone] 06/15/17 7:53 AM

## 2017-06-15 NOTE — Plan of Care (Signed)

## 2017-06-15 NOTE — Plan of Care (Signed)
  Problem: Nutrition: Goal: Adequate nutrition will be maintained Outcome: Progressing   Problem: Elimination: Goal: Will not experience complications related to bowel motility Outcome: Progressing   Problem: Pain Managment: Goal: General experience of comfort will improve Outcome: Progressing   

## 2017-06-15 NOTE — Progress Notes (Signed)
Initial Nutrition Assessment  DOCUMENTATION CODES:   Not applicable  INTERVENTION:  Ensure Enlive po TID, each supplement provides 350 kcal and 20 grams of protein MVI - daily  NUTRITION DIAGNOSIS:   Increased nutrient needs related to acute illness, wound healing as evidenced by estimated needs.  GOAL:   Patient will meet greater than or equal to 90% of their needs  MONITOR:   PO intake, Supplement acceptance, Weight trends, Labs, Skin, I & O's  REASON FOR ASSESSMENT:   Malnutrition Screening Tool    ASSESSMENT:   42 y.o. F admitted on 06/12/17 for MRSA and abscess of right leg. PMH of arthritis, juvenile arthritis, hepatitis, pneumonia, drug abuse (last in 2017), bipolar disorder/schizophrenia/PTSD, and seizures. Pt is current smoker and will occasionally drink beer.   Pt was very weak and lethargic upon dietetic intern visit. Pt reports the last 2 1/2 months only snacking on things like candy; pt reports that while using drugs she doesn't eat much and she has been using drugs regularly for the last 2 1/2 months. Pt reports weighing 68 kg 2 1/2 months ago and now weighs 62.9 kg - 7% weight loss - not significant. Pt reports 2 1/2 months ago eating small frequent meals throughout the day as she doesn't like to feel full. Pt reports not liking the food here and that her appetite is still poor, pt open to supplementation.   Medications reviewed: wellbutrin, methadone, quetiapine, 0.9% 100 ml NaCl, vancocin.   Labs reviewed: CRP 13.6 (H)   Intake/Output Summary (Last 24 hours) at 06/15/2017 1223 Last data filed at 06/15/2017 0600 Gross per 24 hour  Intake 3105 ml  Output 2 ml  Net 3103 ml    NUTRITION - FOCUSED PHYSICAL EXAM:    Most Recent Value  Orbital Region  No depletion  Upper Arm Region  No depletion  Thoracic and Lumbar Region  No depletion  Buccal Region  No depletion  Temple Region  No depletion  Clavicle Bone Region  Mild depletion  Clavicle and Acromion  Bone Region  No depletion  Scapular Bone Region  Unable to assess  Dorsal Hand  No depletion  Patellar Region  No depletion  Anterior Thigh Region  No depletion  Posterior Calf Region  No depletion  Edema (RD Assessment)  None  Hair  Reviewed  Eyes  Reviewed  Mouth  Reviewed  Skin  Reviewed  Nails  Reviewed      Diet Order:   Diet Order           Diet NPO time specified Except for: Sips with Meds  Diet effective now        Diet regular Room service appropriate? Yes; Fluid consistency: Thin  Diet effective now          EDUCATION NEEDS:   Education needs have been addressed  Skin:  Skin Assessment: Skin Integrity Issues: Skin Integrity Issues:: Other (Comment) Other: abscess R leg  Last BM:  06/14/17  Height:   Ht Readings from Last 1 Encounters:  06/13/17  (1.651 m)    Weight:   Wt Readings from Last 1 Encounters:  06/13/17 138 lb 10.7 oz (62.9 kg)   UBW: 130-150 lbs - stated weights, no detailed weight hx in chart  %UBW: 100%   Ideal Body Weight:  56.82 kg  BMI:  Body mass index is 23.08 kg/m.  Estimated Nutritional Needs:   Kcal:  1650-1850 kcal   Protein:  85-100 grams  Fluid:  >/= 1.6  L    Sherrine Maples, Dietetic Intern

## 2017-06-15 NOTE — Progress Notes (Signed)
During assessment RN asked to see abscess on patients right thigh. Patient responded "actually my issue is on the left side". Patient showed RN the left side and bruise noted on hip area. Denied anything was wrong on the right side and would just show RN left side.

## 2017-06-15 NOTE — Progress Notes (Signed)
PROGRESS NOTE    Gloria Meyer  AVW:[id number] DOB: Dec 22, 1975 DOA: 06/12/2017 PCP: Patient, No Pcp Per   Brief Narrative:  Gloria Meyer is a 42 y.o. 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    She states that she relapsed about 3 months ago and has been injecting heroin into her right thigh.  About 2 weeks ago, she noticed 2 sites of abscess.  She lysed it open with a Narcan needle at home and drained white pus that was mixed with brown.  Then about 5 days ago, she started to feel ill with general malaise, fatigue, fever, chills, sweats.  She also admits to productive cough of yellow sputum.  She denies any chest pain.  Has some nausea and has not been able to keep down any food for the past 5 days.  No abdominal pain or diarrhea.  In the ED the Chest x-ray negative for acute cardiopulmonary abnormality.  X-ray right femur shows soft tissue swelling of the distal thigh but no retained needle or osseous abnormality.  Blood cultures obtained.  She was started on IV vancomycin, Zosyn, rifampin. TR[facility] asked for admission for possible Endocarditis. Blood Cultures are positive for MRSA; Currently being worked up. Yesterday AM appeared Septic so 2 Liters of IVF given and started on Maintenance of 100 mL/hr with improvement. Plan for TEE in AM and possible Hip MRI and imaging of Right Knee and Right Big Toe if still complaining of Pain.    Assessment & Plan:   Principal Problem:   Abscess of right leg Active Problems:   Wound infection   H/O bacterial endocarditis   Heroin abuse (HCC)   Hepatitis C   Bipolar 2 disorder (HCC)   Hypokalemia  Sepsis 2/2 to Right thigh abscesses with MRSA bacteremia  -Patient was  tachycardic, Tachypenic, Hypotensive, and has a source of Infection with Bacteremia this AM -CT femur to eval for deeper wound infection that may need drainage. On physical exam, does not have any surrounding erythema, but appears to be healing/scabbing  -Vanc/zosyn/rifampin started in ED due to hx of endocarditis and changed to Vancomycin per ID -Blood cultures showed Staphylococcus bacteremia and was MRSA -Infectious Disease already consulted on medically -Given 2 Liters bolus (30 mg/kg) and then started on IVF Maintenance -Patient had central venous catheter placed yesterday however will need to be removed given her bacteremia and high risk of seeding -Per infectious disease Dr. Astrid Divine need to be removed at some point may consider a long-acting drug such as " IV ORITAVANCIN vs Dalbavancin to facilitate this vs oral drug such as zyvox that has high bioavailability"; Removed CVC yesterday given Peripheral Access  -Checking TTE and will need TEE; TEE to be done tomorrow at 1400 on 06/16/17 -TTE showed Normal LV systolic function; moderate diastolic dysfunction; s/p TVR. -Procalcitonin was 12.25 and improved to 7.26; Lactic Acid Level went from 1.8 -> 1.6 -CRP was 13.6 and was ESR was 28 -Per Dr. Tommy Medal she does have endocarditis of her prosthetic valve she will also need Rifampin in addition to vancomycin as well as 2 weeks of Gentamicin -Repeat blood cultures pending  -CT scan with contrast of thigh ordered and Mild stranding in subcutaneous fat about the right upper leg is compatible  with cellulitis. Negative for abscess, myositis or osteomyelitis. Small Baker's cyst is incidentally noted. 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 daily as well as 50 mg of Quetiapine nightly -Resumed Divalproex 125 mg po qHS and Sertraline 50 mg po Daily  Hypokalemia -Patient's potassium this morning was 3.2 -Replete with Potassium chloride 40 mEq p.o. twice daily x2 doses again -Continue to monitor and replete as necessary -Repeat CMP in the a.m.  Normocytic Anemia -Patient's hemoglobin/hematocrit went from 12.5/35.9 on admission is now 10.1/30.5 -Likely dilutional drop from IV fluid resuscitation -Continue to monitor for signs and symptoms of bleeding -Check anemia panel -Repeat CBC in the AM.  Thrombocytopenia -Patient's platelet count on admission was 79 and has dropped all the way down to 48 but is now 56 -Likely reactive due to infectious -Continue to monitor and repeat CBC in a.m.  Grade 2 Diastolic Dysfunction -Seen on ECHO -Currently not decompensated  Vaginal Yeast Infection -Given 1x Dose of Fluconazole 150 mg po Once -C/w Clotrimazole Vaginal Cream Daily qHS  Left Hip Pain Radiating into Groin -Concern for possible MRSA seeding? -Continue to Monitor and if not improving May need further imaging with MRI  of Hip  Right Knee Pain and Right Big Toe Pain -Concern for Possible MRSA seeding?  -Continue to Monitor and if not improving will obtain X-Rays   DVT prophylaxis: Enoxaparin 40 mg subcu every 24 Code Status: FULL CODE Family Communication: Family member asleep at bedside Disposition Plan: Remain Inpatient for current evaluation and workup  Consultants:   Infectious Diseases    Procedures: CVC Insertion by Dr. Marshell Garfinkel  ECHOCARDIOGRAM ------------------------------------------------------------------- Study Conclusions  - Left ventricle: The cavity size was normal. Wall thickness was   normal. Systolic function was normal. The estimated ejection   fraction was in the range of 60% to 65%. Wall motion was normal;   there were no regional wall motion abnormalities. Features are   consistent with a pseudonormal left ventricular filling pattern,   with concomitant abnormal relaxation and increased filling   pressure (grade 2 diastolic dysfunction). - Tricuspid valve: A bioprosthesis was present.  Impressions:  - Normal LV systolic function; moderate diastolic dysfunction; s/p   TVR.  Antimicrobials: Anti-infectives (From admission, onward)   Start     Dose/Rate Route Frequency Ordered Stop   06/15/17 1100  fluconazole (DIFLUCAN) tablet 150 mg     150 mg Oral  Once 06/15/17 0952 06/15/17 1010   06/15/17 1000  vancomycin (VANCOCIN) IVPB 750 mg/150 ml premix     750 mg 150 mL/hr over 60 Minutes Intravenous Every 12 hours 06/15/17 0749     06/13/17 2200  vancomycin (VANCOCIN) IVPB 1000 mg/200 mL premix  Status:  Discontinued     1,000 mg 200 mL/hr over 60 Minutes Intravenous Every 24 hours 06/13/17 0852 06/15/17 0749   06/13/17 0830  piperacillin-tazobactam (ZOSYN) IVPB 3.375 g  Status:  Discontinued     3.375 g 12.5 mL/hr over 240 Minutes Intravenous Every 8 hours 06/13/17 0823 06/13/17 1555   06/13/17 0030  rifampin (RIFADIN) 300 mg in sodium chloride 0.9 % 100 mL  IVPB  Status:  Discontinued     300 mg 200 mL/hr over 30 Minutes Intravenous Every 8 hours 06/13/17 0017 06/14/17 1416   06/13/17 0030  piperacillin-tazobactam (ZOSYN) IVPB 3.375 g     3.375 g 100 mL/hr over 30 Minutes Intravenous  Once 06/13/17 0017 06/13/17 0147   06/13/17 0030  vancomycin (VANCOCIN) IVPB 1000 mg/200 mL premix     1,000 mg 200 mL/hr over 60 Minutes Intravenous  Once 06/13/17 0027 06/13/17 0216     Subjective: Seen and examined this morning states she was tremendously better than she did yesterday.  No chest pain, shortness breath, nausea, vomiting.  Still complains of pain and states that she had pain in her left hip radiating to groin, right knee, as well as in the right big toe.  Also complained to the about a vaginal yeast infection.  No other concerns or complaints and central line has been removed.  Objective: Vitals:   06/14/17 2110 06/15/17 0243 06/15/17 0604 06/15/17 1454  BP: 106/69 110/75 102/63 109/76  Pulse: 78 92 83 71  Resp: _0 Temp: 98.4 F (36.9 C) 99.6 F (37.6 C) 98.1 F (36.7 C) 98.2 F (36.8 C)  TempSrc: Oral Oral Oral Oral  SpO2: 99% 99% 100% 100%  Weight:      Height:        Intake/Output Summary (Last 24 hours) at 06/15/2017 1633 Last data filed at 06/15/2017 0600 Gross per 24 hour  Intake 3105 ml  Output 2 ml  Net 3103 ml   Filed Weights   06/12/17 2142 06/13/17 0635  Weight: 61.6 kg (135 lb 11.2 oz) 62.9 kg (138 lb 10.7 oz)   Examination: Physical Exam:  Constitutional: Thin Caucasian female who appears calm and is appearing better than yesterday. Eyes: Lids and conjunctive are normal.  Sclera anicteric ENMT: External ears and nose appear normal.  Grossly normal hearing Neck: Supple no JVD.  Right CVC has been removed Respiratory: Diminished to auscultation bilaterally with no appreciable wheezing, rales, rhonchi.  Patient was not tachypneic using accessory muscle breathing Cardiovascular: Regular rate and rhythm.   Has a murmur.  No appreciable lower extremity edema Abdomen: Soft, nontender, nondistended.  Bowel sounds present in 4 quadrants GU: Deferred Musculoskeletal: No clubbing or cyanosis.  No joint deformities noted; has a slightly painful left hip on palpation Skin: Has injection track marks and marks throughout the body with induration on the right leg in 2 separate areas. Neurologic: Cranial nerves II through XII grossly intact with no appreciable focal deficits. Psychiatric: Normal judgment and insight awake and alert and oriented x3.  Improved mood today  Data Reviewed: I have personally reviewed following labs and imaging studies  CBC: Recent Labs  Lab 06/12/17 2335 06/13/17 1640 06/14/17 0354 06/15/17 1017  WBC 2.8* 4.7 4.6 4.1  NEUTROABS 2.4  --   --  2.7  HGB 12.5 11.8* 10.6* 10.1*  HCT 35.9* 35.6* 31.8* 30.5*  MCV 90.2 92.7 91.4 92.4  PLT 79* 52* 48* 56*   Basic Metabolic Panel: Recent Labs  Lab 06/12/17 2335 06/13/17 1640 06/14/17 0354 06/15/17 1017  NA 130*  --  134* 136  K 3.0*  --  3.0* 3.2*  CL 96*  --  101 104  CO2 24  --  26 23  GLUCOSE 93  --  132* 118*  BUN 19  --  15 9  CREATININE 1.21* 0.86 0.75 0.80  CALCIUM 8.2*  --  8.2* 8.0*  MG  --  2.0  --  1.8  PHOS  --   --   --  4.2   GFR: Estimated Creatinine Clearance: 84.1 mL/min (by C-G formula based on SCr of 0.8 mg/dL). Liver Function Tests: Recent Labs  Lab 06/12/17 2335 06/15/17 1017  AST 30 15  ALT 20 13*  ALKPHOS 106 73  BILITOT 1.3* 0.8  PROT 6.5 5.6*  ALBUMIN 2.8* 2.1*   No results for input(s): LIPASE, AMYLASE in the last 168 hours. No results for input(s): AMMONIA in the last 168 hours. Coagulation Profile: No results for input(s): INR, PROTIME in the last 168 hours. Cardiac Enzymes: Recent Labs  Lab 06/12/17 2335  TROPONINI <0.03   BNP (last 3 results) No results for input(s): PROBNP in the last 8760 hours. HbA1C: No results for input(s): HGBA1C in the last 72  hours. CBG: No results for input(s): GLUCAP in the last 168 hours. Lipid Profile: No results for input(s): CHOL, HDL, LDLCALC, TRIG, CHOLHDL, LDLDIRECT in the last 72 hours. Thyroid Function Tests: No results for input(s): TSH, T4TOTAL, FREET4, T3FREE, THYROIDAB in the last 72 hours. Anemia Panel: No results for input(s): VITAMINB12, FOLATE, FERRITIN, TIBC, IRON, RETICCTPCT in the last 72 hours. Sepsis Labs: Recent Labs  Lab 06/12/17 2349 06/14/17 1017 06/14/17 1344 06/15/17 0425  PROCALCITON  --  12.25  --  7.26  LATICACIDVEN 1.69 1.8 1.6  --     Recent Results (from the past 240 hour(s))  Blood Culture (routine x 2)     Status: Abnormal   Collection Time: 06/12/17 11:36 PM  Result Value Ref Range Status   Specimen Description   Final    BLOOD FEMORAL ARTERY LEFT Performed at [facility], [address] [place], [place] [address]    Special Requests   Final    BOTTLES DRAWN AEROBIC AND ANAEROBIC Blood Culture adequate volume Performed at [facility], [address] [place], [place] [address]    Culture  Setup Time   Final    GRAM POSITIVE COCCI IN BOTH AEROBIC AND ANAEROBIC BOTTLES CRITICAL RESULT CALLED TO, READ BACK BY AND VERIFIED WITH: Murray Hodgkins GREEN [id number] MLM Performed at [facility], [place] [address], [place], [place] [address]    Culture METHICILLIN RESISTANT STAPHYLOCOCCUS AUREUS (A)  Final   Report Status 06/15/2017 FINAL  Final   Organism ID, Bacteria METHICILLIN RESISTANT STAPHYLOCOCCUS AUREUS  Final      Susceptibility   Methicillin resistant staphylococcus aureus - MIC*    CIPROFLOXACIN >=8 RESISTANT Resistant     ERYTHROMYCIN >=8 RESISTANT Resistant     GENTAMICIN <=0.5 SENSITIVE Sensitive     OXACILLIN >=4 RESISTANT Resistant     TETRACYCLINE <=1 SENSITIVE Sensitive     VANCOMYCIN 1 SENSITIVE Sensitive     TRIMETH/SULFA 160 RESISTANT Resistant     CLINDAMYCIN >=8  RESISTANT Resistant     RIFAMPIN <=0.5 SENSITIVE  Sensitive     Inducible Clindamycin NEGATIVE Sensitive     * METHICILLIN RESISTANT STAPHYLOCOCCUS AUREUS  Blood Culture ID Panel (Reflexed)     Status: Abnormal   Collection Time: 06/12/17 11:36 PM  Result Value Ref Range Status   Enterococcus species NOT DETECTED NOT DETECTED Final   Listeria monocytogenes NOT DETECTED NOT DETECTED Final   Staphylococcus species DETECTED (A) NOT DETECTED Final    Comment: CRITICAL RESULT CALLED TO, READ BACK BY AND VERIFIED WITH: PHARMD T GREEN [id number] MLM    Staphylococcus aureus DETECTED (A) NOT DETECTED Final    Comment: Methicillin (oxacillin)-resistant Staphylococcus aureus (MRSA). MRSA is predictably resistant to beta-lactam antibiotics (except ceftaroline). Preferred therapy is vancomycin unless clinically contraindicated. Patient requires contact precautions if  hospitalized. CRITICAL RESULT CALLED TO, READ BACK BY AND VERIFIED WITH: PHARMD T GREEN [id number] MLM    Methicillin resistance DETECTED (A) NOT DETECTED Final    Comment: CRITICAL RESULT CALLED TO, READ BACK BY AND VERIFIED WITH: PHARMD T GREEN [id number] MLM    Streptococcus species NOT DETECTED NOT DETECTED Final   Streptococcus agalactiae NOT DETECTED NOT DETECTED Final   Streptococcus pneumoniae NOT DETECTED NOT DETECTED Final   Streptococcus pyogenes NOT DETECTED NOT DETECTED Final   Acinetobacter baumannii NOT DETECTED NOT DETECTED Final   Enterobacteriaceae species NOT DETECTED NOT DETECTED Final   Enterobacter cloacae complex NOT DETECTED NOT DETECTED Final   Escherichia coli NOT DETECTED NOT DETECTED Final   Klebsiella oxytoca NOT DETECTED NOT DETECTED Final   Klebsiella pneumoniae NOT DETECTED NOT DETECTED Final   Proteus species NOT DETECTED NOT DETECTED Final   Serratia marcescens NOT DETECTED NOT DETECTED Final   Haemophilus influenzae NOT DETECTED NOT DETECTED Final   Neisseria meningitidis NOT DETECTED NOT DETECTED Final   Pseudomonas aeruginosa NOT DETECTED  NOT DETECTED Final   Candida albicans NOT DETECTED NOT DETECTED Final   Candida glabrata NOT DETECTED NOT DETECTED Final   Candida krusei NOT DETECTED NOT DETECTED Final   Candida parapsilosis NOT DETECTED NOT DETECTED Final   Candida tropicalis NOT DETECTED NOT DETECTED Final    Comment: Performed at [facility], [place]. [address] [place], [place] [address]  Blood Culture (routine x 2)     Status: Abnormal   Collection Time: 06/13/17 12:30 AM  Result Value Ref Range Status   Specimen Description   Final    BLOOD LEFT ARM UPPER Performed at [facility], [address] [place], [place] [address]    Special Requests   Final    BOTTLES DRAWN AEROBIC AND ANAEROBIC Blood Culture adequate volume Performed at [facility], [address] [place], [place] [address]    Culture  Setup Time   Final    GRAM POSITIVE COCCI IN BOTH AEROBIC AND ANAEROBIC BOTTLES    Culture (A)  Final    STAPHYLOCOCCUS AUREUS SUSCEPTIBILITIES PERFORMED ON PREVIOUS CULTURE WITHIN THE LAST 5 DAYS. Performed at [facility], [place] [address], [place], [place] [address]    Report Status 06/15/2017 FINAL  Final  Culture, blood (Routine X 2) w Reflex to ID Panel     Status: None (Preliminary result)   Collection Time: 06/13/17  7:10 PM  Result Value Ref Range Status   Specimen Description   Final    BLOOD RIGHT FOREARM Performed at [facility], [place] [address] [place],  [address]  Special Requests   Final    BOTTLES DRAWN AEROBIC ONLY Blood Culture results may not be optimal due to an inadequate volume of blood received in culture bottles Performed at [place] [address] [place], [place] [address]    Culture   Final    NO GROWTH 2 DAYS Performed at [place] [address], [place], [place] [address]    Report Status PENDING  Incomplete  MRSA PCR Screening     Status: None   Collection Time: 06/15/17  8:33 AM  Result  Value Ref Range Status   MRSA by PCR NEGATIVE NEGATIVE Final    Comment:        The GeneXpert MRSA Assay (FDA approved for NASAL specimens only), is one component of a comprehensive MRSA colonization surveillance program. It is not intended to diagnose MRSA infection nor to guide or monitor treatment for MRSA infections. Performed at [facility], [place] [address], [place], [place] [address]     Radiology Studies: Ct Femur Right W Contrast  Result Date: 06/15/2017 CLINICAL DATA:  Drug abuser with injections in the right thigh. Right thigh pain swelling for 2 weeks. Malaise, fatigue, fever and chills. EXAM: CT OF THE LOWER RIGHT EXTREMITY WITH CONTRAST TECHNIQUE: Multidetector CT imaging of the lower right extremity was performed according to the standard protocol following intravenous contrast administration. COMPARISON:  Plain films right upper leg 06/12/2017. CONTRAST:  100 ml ISOVUE-300 IOPAMIDOL (ISOVUE-300) INJECTION 61% FINDINGS: Bones/Joint/Cartilage Appear normal throughout without fracture, focal lesion, periosteal reaction or bony destructive change. No evidence of arthropathy about the hip or knee. Ligaments Suboptimally assessed by CT. Muscles and Tendons Intact and normal in appearance. Fat planes within muscle are preserved. No intramuscular abscess is identified. Soft tissues Mild stranding in subcutaneous fat about the thigh is noted. No focal fluid collection is seen. The patient has a trace knee joint effusion. Small Baker's cyst is noted. Imaged intrapelvic contents demonstrate a small amount of free pelvic fluid consistent with physiologic change. IMPRESSION: Mild stranding in subcutaneous fat about the right upper leg is compatible with cellulitis. Negative for abscess, myositis or osteomyelitis. Small Baker's cyst is incidentally noted. Electronically Signed   By: Inge Rise M.D.   On: 06/15/2017 07:25   Scheduled Meds: . buPROPion  300 mg Oral Daily   . clotrimazole  1 Applicatorful Vaginal QHS  . divalproex  125 mg Oral QHS  . enoxaparin (LOVENOX) injection  40 mg Subcutaneous Daily  . feeding supplement (ENSURE ENLIVE)  237 mL Oral TID BM  . iopamidol      . methadone  60 mg Oral Daily  . multivitamin with minerals  1 tablet Oral Daily  . QUEtiapine  50 mg Oral QHS  . sertraline  50 mg Oral Daily  . sodium chloride flush  10-40 mL Intracatheter Q12H   Continuous Infusions: . sodium chloride 100 mL/hr at 06/15/17 0846  . vancomycin 750 mg (06/15/17 0900)    LOS: 2 days   Kerney Elbe, DO [facility] Pager [phone]  If 7PM-7AM, please contact night-coverage [email or web address] Password [facility] 06/15/2017, 4:33 PM

## 2017-06-16 ENCOUNTER — Encounter (HOSPITAL_COMMUNITY): Payer: Self-pay | Admitting: *Deleted

## 2017-06-16 ENCOUNTER — Inpatient Hospital Stay (HOSPITAL_COMMUNITY): Payer: Self-pay

## 2017-06-16 ENCOUNTER — Inpatient Hospital Stay (HOSPITAL_COMMUNITY): Payer: Self-pay | Admitting: Certified Registered Nurse Anesthetist

## 2017-06-16 ENCOUNTER — Encounter (HOSPITAL_COMMUNITY)
Admission: EM | Discharge status: Against Medical Advice | Payer: Self-pay | Source: Home / Self Care | Attending: Internal Medicine

## 2017-06-16 DIAGNOSIS — I340 Nonrheumatic mitral (valve) insufficiency: Secondary | ICD-10-CM

## 2017-06-16 DIAGNOSIS — B9562 Methicillin resistant Staphylococcus aureus infection as the cause of diseases classified elsewhere: Secondary | ICD-10-CM | POA: Diagnosis present

## 2017-06-16 DIAGNOSIS — I330 Acute and subacute infective endocarditis: Secondary | ICD-10-CM | POA: Insufficient documentation

## 2017-06-16 DIAGNOSIS — R7881 Bacteremia: Secondary | ICD-10-CM | POA: Diagnosis present

## 2017-06-16 DIAGNOSIS — B1920 Unspecified viral hepatitis C without hepatic coma: Secondary | ICD-10-CM

## 2017-06-16 HISTORY — PX: TEE WITHOUT CARDIOVERSION: SHX5443

## 2017-06-16 LAB — COMPREHENSIVE METABOLIC PANEL
ALT: 14 U/L (ref 14–54)
AST: 20 U/L (ref 15–41)
Albumin: 2.4 g/dL — ABNORMAL LOW (ref 3.5–5.0)
Alkaline Phosphatase: 85 U/L (ref 38–126)
Anion gap: 9 (ref 5–15)
BUN: 9 mg/dL (ref 6–20)
CHLORIDE: 104 mmol/L (ref 101–111)
CO2: 24 mmol/L (ref 22–32)
CREATININE: 0.72 mg/dL (ref 0.44–1.00)
Calcium: 8.3 mg/dL — ABNORMAL LOW (ref 8.9–10.3)
GFR calc Af Amer: >60 mL/min (ref >60)
GFR calc non Af Amer: >60 mL/min (ref >60)
Glucose, Bld: 74 mg/dL (ref 65–99)
Potassium: 4.6 mmol/L (ref 3.5–5.1)
SODIUM: 137 mmol/L (ref 135–145)
Total Bilirubin: 0.7 mg/dL (ref 0.3–1.2)
Total Protein: 6.6 g/dL (ref 6.5–8.1)

## 2017-06-16 LAB — CBC WITH DIFFERENTIAL/PLATELET
BASOS PCT: 0 %
Basophils Absolute: 0 10*3/uL (ref 0.0–0.1)
EOS ABS: 0 10*3/uL (ref 0.0–0.7)
Eosinophils Relative: 1 %
HCT: 35.9 % — ABNORMAL LOW (ref 36.0–46.0)
Hemoglobin: 11.8 g/dL — ABNORMAL LOW (ref 12.0–15.0)
Lymphocytes Relative: 19 %
Lymphs Abs: 0.8 10*3/uL (ref 0.7–4.0)
MCH: 30.2 pg (ref 26.0–34.0)
MCHC: 32.9 g/dL (ref 30.0–36.0)
MCV: 91.8 fL (ref 78.0–100.0)
Monocytes Absolute: 0.3 10*3/uL (ref 0.1–1.0)
Monocytes Relative: 7 %
NEUTROS ABS: 2.9 10*3/uL (ref 1.7–7.7)
NEUTROS PCT: 73 %
PLATELETS: 71 10*3/uL — AB (ref 150–400)
RBC: 3.91 MIL/uL (ref 3.87–5.11)
RDW: 15.3 % (ref 11.5–15.5)
WBC: 4 10*3/uL (ref 4.0–10.5)

## 2017-06-16 LAB — PROCALCITONIN: PROCALCITONIN: 4.36 ng/mL

## 2017-06-16 LAB — MAGNESIUM: Magnesium: 1.8 mg/dL (ref 1.7–2.4)

## 2017-06-16 LAB — PHOSPHORUS: Phosphorus: 3.8 mg/dL (ref 2.5–4.6)

## 2017-06-16 SURGERY — ECHOCARDIOGRAM, TRANSESOPHAGEAL
Anesthesia: General

## 2017-06-16 MED ORDER — GENTAMICIN IN SALINE 1-0.9 MG/ML-% IV SOLN
100.0000 mg | Freq: Two times a day (BID) | INTRAVENOUS | Status: DC
  Administered 2017-06-16: 100 mg via INTRAVENOUS
  Filled 2017-06-16 (×2): qty 100

## 2017-06-16 MED ORDER — SODIUM CHLORIDE 0.9 % IV SOLN
INTRAVENOUS | Status: DC
  Administered 2017-06-16: 13:00:00 via INTRAVENOUS

## 2017-06-16 MED ORDER — PROPOFOL 10 MG/ML IV BOLUS
INTRAVENOUS | Status: DC | PRN
  Administered 2017-06-16: 40 mg via INTRAVENOUS
  Administered 2017-06-16: 45 mg via INTRAVENOUS

## 2017-06-16 MED ORDER — LIDOCAINE HCL (CARDIAC) PF 100 MG/5ML IV SOSY
PREFILLED_SYRINGE | INTRAVENOUS | Status: DC | PRN
  Administered 2017-06-16: 100 mg via INTRAVENOUS

## 2017-06-16 MED ORDER — PROPOFOL 500 MG/50ML IV EMUL
INTRAVENOUS | Status: DC | PRN
  Administered 2017-06-16: 100 ug/kg/min via INTRAVENOUS

## 2017-06-16 NOTE — Progress Notes (Signed)
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   She states that she relapsed about 3 months ago and has been injecting heroin into her right thigh. About 2 weeks ago, she noticed 2 sites of abscess. She lysed it openwith a Narcan needle at home and drained white pus that was mixed with brown. Then about 5 days ago, she started to feel ill with general malaise, fatigue, fever, chills, sweats. She also admits to productive cough of yellow sputum. She denies any chest pain. Has some nausea and has not been able to keep down any food for the past 5 days.    Subjective: Feels pain through out her entire body and nausea   Assessment & Plan:   Principal Problem:   Acute bacterial endocarditis/  MRSA bacteremia/  Abscess of right leg/ Heroin abuse - TEE today shows infected Tricuspid bioprosthesis - cont anibiotics per ID - left thigh cellulitis has improved- no abscess noted on CT  Active Problems:  IV Heorine abuse - cont Methadone       Hepatitis C - has not received treatment for this  Bipolar disorder  -Continue with Bupropion 300 mg p.o. daily as well as 50 mg of Quetiapine, Divalproex 125 mg po qHS and Sertraline 50 mg po Daily  Acute thrombocytopenia - due to sepsis- follow  Candida vaginitis - Fluconazole x 1 and then Clotrimazole ordered by Dr Marland Mcalpine  Chronic grade 2 dCHF - stable    DVT prophylaxis: Lovenox Code Status: full code Family Communication:  Disposition Plan: follow on tele Consultants:   ID  Cardiology  (for TEE)  Procedures:   2 d ECHO Study Conclusions  - Left ventricle: The cavity size was normal. Wall thickness was normal. Systolic function was normal. The estimated ejection fraction was in the range of 60% to 65%. Wall motion was normal; there were no regional wall motion abnormalities. Features are consistent with a pseudonormal left ventricular filling pattern, with concomitant abnormal relaxation and increased filling pressure (grade 2 diastolic dysfunction). - Tricuspid valve: A bioprosthesis was present.  Impressions:  - Normal LV systolic function; moderate diastolic dysfunction; s/p TVR.   TEE 5/29 Antimicrobials:  Anti-infectives (From admission, onward)   Start     Dose/Rate Route Frequency Ordered Stop   06/15/17 1100  fluconazole (DIFLUCAN) tablet 150 mg     150 mg Oral  Once 06/15/17 0952 06/15/17 1010   06/15/17 1000  vancomycin (VANCOCIN) IVPB 750 mg/150 ml premix     750 mg 150 mL/hr over 60 Minutes Intravenous Every 12 hours 06/15/17 0749     06/13/17 2200  vancomycin (VANCOCIN) IVPB 1000 mg/200 mL premix  Status:  Discontinued     1,000 mg 200 mL/hr over 60 Minutes Intravenous Every 24 hours 06/13/17 0852 06/15/17 0749   06/13/17 0830  piperacillin-tazobactam (ZOSYN) IVPB 3.375 g  Status:  Discontinued     3.375 g 12.5 mL/hr over 240 Minutes Intravenous Every 8 hours 06/13/17 0823 06/13/17 1555   06/13/17 0030  rifampin (RIFADIN) 300 mg in sodium chloride 0.9 % 100 mL IVPB  Status:  Discontinued     300 mg  200 mL/hr over 30 Minutes Intravenous Every 8 hours 06/13/17 0017 06/14/17 1416   06/13/17 0030  piperacillin-tazobactam (ZOSYN) IVPB 3.375 g     3.375 g 100 mL/hr over 30 Minutes Intravenous  Once 06/13/17 0017 06/13/17 0147   06/13/17 0030  vancomycin (VANCOCIN) IVPB 1000 mg/200 mL premix     1,000 mg 200 mL/hr over 60 Minutes Intravenous  Once 06/13/17 0027 06/13/17 0216       Objective: Vitals:   06/16/17 1258 06/16/17  1420 06/16/17 1430 06/16/17 1439  BP: 111/75 95/66 (!) 88/58 (!) 96/55  Pulse: 82 81 70 74  Resp: 11 13 12 12   Temp: 99.2 F (37.3 C) 97.9 F (36.6 C)    TempSrc: Oral Oral    SpO2: 96% 98% 97% 99%  Weight: 62.9 kg (138 lb 10.7 oz)     Height: 5\' 5"  (1.651 m)       Intake/Output Summary (Last 24 hours) at 06/16/2017 1541 Last data filed at 06/16/2017 0211 Gross per 24 hour  Intake 1886.67 ml  Output -  Net 1886.67 ml   Filed Weights   06/12/17 2142 06/13/17 0635 06/16/17 1258  Weight: 61.6 kg (135 lb 11.2 oz) 62.9 kg (138 lb 10.7 oz) 62.9 kg (138 lb 10.7 oz)    Examination: General exam: Appears comfortable  HEENT: PERRLA, oral mucosa moist, no sclera icterus or thrush Respiratory system: Clear to auscultation. Respiratory effort normal. Cardiovascular system: S1 & S2 heard, RRR.   Gastrointestinal system: Abdomen soft, non-tender, nondistended. Normal bowel sound. No organomegaly Central nervous system: Alert and oriented. No focal neurological deficits. Extremities: No cyanosis, clubbing or edema Skin: numerous scabs and bruises all over her body Psychiatry:  Flat affect   Data Reviewed: I have personally reviewed following labs and imaging studies  CBC: Recent Labs  Lab 06/12/17 2335 06/13/17 1640 06/14/17 0354 06/15/17 1017 06/16/17 0436  WBC 2.8* 4.7 4.6 4.1 4.0  NEUTROABS 2.4  --   --  2.7 2.9  HGB 12.5 11.8* 10.6* 10.1* 11.8*  HCT 35.9* 35.6* 31.8* 30.5* 35.9*  MCV 90.2 92.7 91.4 92.4 91.8  PLT 79* 52* 48* 56* 71*   Basic Metabolic Panel: Recent Labs  Lab 06/12/17 2335 06/13/17 1640 06/14/17 0354 06/15/17 1017 06/16/17 0436 06/16/17 0504  NA 130*  --  134* 136 137  --   K 3.0*  --  3.0* 3.2* 4.6  --   CL 96*  --  101 104 104  --   CO2 24  --  26 23 24   --   GLUCOSE 93  --  132* 118* 74  --   BUN 19  --  15 9 9   --   CREATININE 1.21* 0.86 0.75 0.80 0.72  --   CALCIUM 8.2*  --  8.2* 8.0* 8.3*  --   MG  --  2.0  --  1.8 1.8  --   PHOS  --   --    --  4.2  --  3.8   GFR: Estimated Creatinine Clearance: 84.1 mL/min (by C-G formula based on SCr of 0.72 mg/dL). Liver Function Tests: Recent Labs  Lab 06/12/17 2335 06/15/17 1017 06/16/17 0436  AST 30 15 20   ALT 20 13* 14  ALKPHOS 106 73 85  BILITOT 1.3* 0.8 0.7  PROT 6.5 5.6* 6.6  ALBUMIN 2.8* 2.1* 2.4*   No results for input(s): LIPASE, AMYLASE in the last 168 hours. No results for input(s): AMMONIA in the last 168 hours. Coagulation Profile:  No results for input(s): INR, PROTIME in the last 168 hours. Cardiac Enzymes: Recent Labs  Lab 06/12/17 2335  TROPONINI <0.03   BNP (last 3 results) No results for input(s): PROBNP in the last 8760 hours. HbA1C: No results for input(s): HGBA1C in the last 72 hours. CBG: No results for input(s): GLUCAP in the last 168 hours. Lipid Profile: No results for input(s): CHOL, HDL, LDLCALC, TRIG, CHOLHDL, LDLDIRECT in the last 72 hours. Thyroid Function Tests: No results for input(s): TSH, T4TOTAL, FREET4, T3FREE, THYROIDAB in the last 72 hours. Anemia Panel: No results for input(s): VITAMINB12, FOLATE, FERRITIN, TIBC, IRON, RETICCTPCT in the last 72 hours. Urine analysis:    Component Value Date/Time   COLORURINE YELLOW 06/13/2017 0540   APPEARANCEUR CLOUDY (A) 06/13/2017 0540   LABSPEC <1.005 (L) 06/13/2017 0540   PHURINE 6.5 06/13/2017 0540   GLUCOSEU NEGATIVE 06/13/2017 0540   HGBUR MODERATE (A) 06/13/2017 0540   BILIRUBINUR NEGATIVE 06/13/2017 0540   KETONESUR NEGATIVE 06/13/2017 0540   PROTEINUR NEGATIVE 06/13/2017 0540   NITRITE NEGATIVE 06/13/2017 0540   LEUKOCYTESUR NEGATIVE 06/13/2017 0540   Sepsis Labs: @LABRCNTIP (procalcitonin:4,lacticidven:4) ) Recent Results (from the past 240 hour(s))  Blood Culture (routine x 2)     Status: Abnormal   Collection Time: 06/12/17 11:36 PM  Result Value Ref Range Status   Specimen Description   Final    BLOOD FEMORAL ARTERY LEFT Performed at [facility], [address] [place], [place] [address]    Special Requests   Final    BOTTLES DRAWN AEROBIC AND ANAEROBIC Blood Culture adequate volume Performed at [facility], [address] [place], [place] [address]    Culture  Setup Time   Final    GRAM POSITIVE COCCI IN BOTH AEROBIC AND ANAEROBIC BOTTLES CRITICAL RESULT CALLED TO, READ BACK BY AND VERIFIED WITH: Anette Riedel GREEN [id number] MLM Performed at [facility], [address] [place], [place] [address]    Culture METHICILLIN RESISTANT STAPHYLOCOCCUS AUREUS (A)  Final   Report Status 06/15/2017 FINAL  Final   Organism ID, Bacteria METHICILLIN RESISTANT STAPHYLOCOCCUS AUREUS  Final      Susceptibility   Methicillin resistant staphylococcus aureus - MIC*    CIPROFLOXACIN >=8 RESISTANT Resistant     ERYTHROMYCIN >=8 RESISTANT Resistant     GENTAMICIN <=0.5 SENSITIVE Sensitive     OXACILLIN >=4 RESISTANT Resistant     TETRACYCLINE <=1 SENSITIVE Sensitive     VANCOMYCIN 1 SENSITIVE Sensitive     TRIMETH/SULFA 160 RESISTANT Resistant     CLINDAMYCIN >=8 RESISTANT Resistant     RIFAMPIN <=0.5 SENSITIVE Sensitive     Inducible Clindamycin NEGATIVE Sensitive     * METHICILLIN RESISTANT STAPHYLOCOCCUS AUREUS  Blood Culture ID Panel (Reflexed)     Status: Abnormal   Collection Time: 06/12/17 11:36 PM  Result Value Ref Range Status   Enterococcus species NOT DETECTED NOT DETECTED Final   Listeria monocytogenes NOT DETECTED NOT DETECTED Final   Staphylococcus species DETECTED (A) NOT DETECTED Final    Comment: CRITICAL RESULT CALLED TO, READ BACK BY AND VERIFIED WITH: PHARMD T GREEN [id number] MLM    Staphylococcus aureus DETECTED (A) NOT DETECTED Final    Comment: Methicillin (oxacillin)-resistant Staphylococcus aureus (MRSA). MRSA is predictably resistant to beta-lactam antibiotics (except ceftaroline). Preferred therapy is vancomycin unless clinically contraindicated. Patient requires contact precautions if    hospitalized. CRITICAL RESULT CALLED TO, READ BACK BY AND VERIFIED WITH: PHARMD T  GREEN [id number] MLM    Methicillin resistance DETECTED (A) NOT DETECTED Final    Comment: CRITICAL RESULT CALLED TO, READ BACK BY AND VERIFIED WITH: PHARMD T GREEN [id number] MLM    Streptococcus species NOT DETECTED NOT DETECTED Final   Streptococcus agalactiae NOT DETECTED NOT DETECTED Final   Streptococcus pneumoniae NOT DETECTED NOT DETECTED Final   Streptococcus pyogenes NOT DETECTED NOT DETECTED Final   Acinetobacter baumannii NOT DETECTED NOT DETECTED Final   Enterobacteriaceae species NOT DETECTED NOT DETECTED Final   Enterobacter cloacae complex NOT DETECTED NOT DETECTED Final   Escherichia coli NOT DETECTED NOT DETECTED Final   Klebsiella oxytoca NOT DETECTED NOT DETECTED Final   Klebsiella pneumoniae NOT DETECTED NOT DETECTED Final   Proteus species NOT DETECTED NOT DETECTED Final   Serratia marcescens NOT DETECTED NOT DETECTED Final   Haemophilus influenzae NOT DETECTED NOT DETECTED Final   Neisseria meningitidis NOT DETECTED NOT DETECTED Final   Pseudomonas aeruginosa NOT DETECTED NOT DETECTED Final   Candida albicans NOT DETECTED NOT DETECTED Final   Candida glabrata NOT DETECTED NOT DETECTED Final   Candida krusei NOT DETECTED NOT DETECTED Final   Candida parapsilosis NOT DETECTED NOT DETECTED Final   Candida tropicalis NOT DETECTED NOT DETECTED Final    Comment: Performed at [facility], [address], [place], [place] [address]  Blood Culture (routine x 2)     Status: Abnormal   Collection Time: 06/13/17 12:30 AM  Result Value Ref Range Status   Specimen Description   Final    BLOOD LEFT ARM UPPER Performed at [facility], [address] [place], [place] [address]    Special Requests   Final    BOTTLES DRAWN AEROBIC AND ANAEROBIC Blood Culture adequate volume Performed at [facility], [address] [place], [place] [address]    Culture   Setup Time   Final    GRAM POSITIVE COCCI IN BOTH AEROBIC AND ANAEROBIC BOTTLES    Culture (A)  Final    STAPHYLOCOCCUS AUREUS SUSCEPTIBILITIES PERFORMED ON PREVIOUS CULTURE WITHIN THE LAST 5 DAYS. Performed at [facility], [address], [place], [place] [address]    Report Status 06/15/2017 FINAL  Final  Culture, blood (Routine X 2) w Reflex to ID Panel     Status: None (Preliminary result)   Collection Time: 06/13/17  7:10 PM  Result Value Ref Range Status   Specimen Description   Final    BLOOD RIGHT FOREARM Performed at [facility], [address], [place], [place] [address]    Special Requests   Final    BOTTLES DRAWN AEROBIC ONLY Blood Culture results may not be optimal due to an inadequate volume of blood received in culture bottles Performed at [facility], [address] [place], [place] [address]    Culture   Final    NO GROWTH 3 DAYS Performed at [facility], [address] [place], [place] [address]    Report Status PENDING  Incomplete  MRSA PCR Screening     Status: None   Collection Time: 06/15/17  8:33 AM  Result Value Ref Range Status   MRSA by PCR NEGATIVE NEGATIVE Final    Comment:        The GeneXpert MRSA Assay (FDA approved for NASAL specimens only), is one component of a comprehensive MRSA colonization surveillance program. It is not intended to diagnose MRSA infection nor to  guide or monitor treatment for MRSA infections. Performed at [facility], [address], [place], [place] [address]   Culture, blood (Routine X 2) w Reflex to ID Panel     Status: None (Preliminary result)   Collection Time: 06/15/17 10:17 AM  Result Value Ref Range Status   Specimen Description   Final    BLOOD LEFT FOREARM Performed at [facility], [address] [place], [place] [address]    Special Requests   Final    BOTTLES DRAWN AEROBIC ONLY Blood Culture results may not be optimal  due to an inadequate volume of blood received in culture bottles Performed at [facility], [address] [place], [place] [address]    Culture   Final    NO GROWTH 1 DAY Performed at [facility], [address] [place], [place] [address]    Report Status PENDING  Incomplete  Culture, blood (Routine X 2) w Reflex to ID Panel     Status: None (Preliminary result)   Collection Time: 06/15/17 10:17 AM  Result Value Ref Range Status   Specimen Description   Final    BLOOD LEFT HAND Performed at [facility], [address], [place], [place] [address]    Special Requests   Final    BOTTLES DRAWN AEROBIC ONLY Blood Culture adequate volume Performed at [facility], [address] [place], [place] [address]    Culture   Final    NO GROWTH 1 DAY Performed at [facility], [address], [place], [place] [address]    Report Status PENDING  Incomplete         Radiology Studies: Ct Femur Right W Contrast  Result Date: 06/15/2017 CLINICAL DATA:  Drug abuser with injections in the right thigh. Right thigh pain swelling for 2 weeks. Malaise, fatigue, fever and chills. EXAM: CT OF THE LOWER RIGHT EXTREMITY WITH CONTRAST TECHNIQUE: Multidetector CT imaging of the lower right extremity was performed according to the standard protocol following intravenous contrast administration. COMPARISON:  Plain films right upper leg 06/12/2017. CONTRAST:  100 ml ISOVUE-300 IOPAMIDOL (ISOVUE-300) INJECTION 61% FINDINGS: Bones/Joint/Cartilage Appear normal throughout without fracture, focal lesion, periosteal reaction or bony destructive change. No evidence of arthropathy about the hip or knee. Ligaments Suboptimally assessed by CT. Muscles and Tendons Intact and normal in appearance. Fat planes within muscle are preserved. No intramuscular abscess is identified. Soft tissues Mild stranding in subcutaneous fat about the thigh is noted. No  focal fluid collection is seen. The patient has a trace knee joint effusion. Small Baker's cyst is noted. Imaged intrapelvic contents demonstrate a small amount of free pelvic fluid consistent with physiologic change. IMPRESSION: Mild stranding in subcutaneous fat about the right upper leg is compatible with cellulitis. Negative for abscess, myositis or osteomyelitis. Small Baker's cyst is incidentally noted. Electronically Signed   By: Drusilla Kanner M.D.   On: 06/15/2017 07:25      Scheduled Meds: . buPROPion  300 mg Oral Daily  . clotrimazole  1 Applicatorful Vaginal QHS  . divalproex  125 mg Oral QHS  . enoxaparin (LOVENOX) injection  40 mg Subcutaneous Daily  . feeding supplement (ENSURE ENLIVE)  237 mL Oral TID BM  . methadone  60 mg Oral Daily  . multivitamin with minerals  1 tablet Oral Daily  . QUEtiapine  50 mg Oral QHS  . sertraline  50 mg Oral Daily  . sodium chloride  flush  10-40 mL Intracatheter Q12H   Continuous Infusions: . vancomycin Stopped (06/16/17 1200)     LOS: 3 days    Time spent in minutes: 35    Calvert Cantor, MD [facility] Pager: [email or web address] Password [facility] 06/16/2017, 3:41 PM

## 2017-06-16 NOTE — Progress Notes (Signed)
      Subjective: Pt in stretcher to go to [facility]   Antibiotics:  Anti-infectives (From admission, onward)   Start     Dose/Rate Route Frequency Ordered Stop   06/15/17 1100  fluconazole (DIFLUCAN) tablet 150 mg     150 mg Oral  Once 06/15/17 0952 06/15/17 1010   06/15/17 1000  vancomycin (VANCOCIN) IVPB 750 mg/150 ml premix     750 mg 150 mL/hr over 60 Minutes Intravenous Every 12 hours 06/15/17 0749     06/13/17 2200  vancomycin (VANCOCIN) IVPB 1000 mg/200 mL premix  Status:  Discontinued     1,000 mg 200 mL/hr over 60 Minutes Intravenous Every 24 hours 06/13/17 0852 06/15/17 0749   06/13/17 0830  piperacillin-tazobactam (ZOSYN) IVPB 3.375 g  Status:  Discontinued     3.375 g 12.5 mL/hr over 240 Minutes Intravenous Every 8 hours 06/13/17 0823 06/13/17 1555   06/13/17 0030  rifampin (RIFADIN) 300 mg in sodium chloride 0.9 % 100 mL IVPB  Status:  Discontinued     300 mg 200 mL/hr over 30 Minutes Intravenous Every 8 hours 06/13/17 0017 06/14/17 1416   06/13/17 0030  piperacillin-tazobactam (ZOSYN) IVPB 3.375 g     3.375 g 100 mL/hr over 30 Minutes Intravenous  Once 06/13/17 0017 06/13/17 0147   06/13/17 0030  vancomycin (VANCOCIN) IVPB 1000 mg/200 mL premix     1,000 mg 200 mL/hr over 60 Minutes Intravenous  Once 06/13/17 0027 06/13/17 0216      Medications: Scheduled Meds: . buPROPion  300 mg Oral Daily  . clotrimazole  1 Applicatorful Vaginal QHS  . divalproex  125 mg Oral QHS  . enoxaparin (LOVENOX) injection  40 mg Subcutaneous Daily  . feeding supplement (ENSURE ENLIVE)  237 mL Oral TID BM  . methadone  60 mg Oral Daily  . multivitamin with minerals  1 tablet Oral Daily  . potassium chloride  40 mEq Oral BID  . QUEtiapine  50 mg Oral QHS  . sertraline  50 mg Oral Daily  . sodium chloride flush  10-40 mL Intracatheter Q12H   Continuous Infusions: . vancomycin Stopped (06/15/17 2142)   PRN Meds:.acetaminophen **OR** acetaminophen, ibuprofen, ondansetron **OR**  ondansetron (ZOFRAN) IV, sodium chloride flush    Objective: Weight change:   Intake/Output Summary (Last 24 hours) at 06/16/2017 1246 Last data filed at 06/16/2017 0211 Gross per 24 hour  Intake 1886.67 ml  Output -  Net 1886.67 ml   Blood pressure 123/81, pulse 84, temperature 100.1 F (37.8 C), temperature source Oral, resp. rate 20, height 5' 5" (1.651 m), weight 138 lb 10.7 oz (62.9 kg), SpO2 95 %. Temp:  [98.2 F (36.8 C)-100.1 F (37.8 C)] 100.1 F (37.8 C) (05/29 0510) Pulse Rate:  [71-93] 84 (05/29 0510) Resp:  [18-20] 20 (05/29 0510) BP: (107-123)/(72-81) 123/81 (05/29 0510) SpO2:  [95 %-100 %] 95 % (05/29 0510)  Physical Exam: General: Alert and awake, oriented x3, in stretcher Neuro: nonfocal  CBC:    BMET Recent Labs    06/15/17 1017 06/16/17 0436  NA 136 137  K 3.2* 4.6  CL 104 104  CO2 23 24  GLUCOSE 118* 74  BUN 9 9  CREATININE 0.80 0.72  CALCIUM 8.0* 8.3*     Liver Panel  Recent Labs    06/15/17 1017 06/16/17 0436  PROT 5.6* 6.6  ALBUMIN 2.1* 2.4*  AST 15 20  ALT 13* 14  ALKPHOS 73 85  BILITOT 0.8 0.7         Sedimentation Rate Recent Labs    06/15/17 0425  ESRSEDRATE 28*   C-Reactive Protein Recent Labs    06/15/17 0427  CRP 13.6*    Micro Results: Recent Results (from the past 720 hour(s))  Blood Culture (routine x 2)     Status: Abnormal   Collection Time: 06/12/17 11:36 PM  Result Value Ref Range Status   Specimen Description   Final    BLOOD FEMORAL ARTERY LEFT Performed at [facility] [place], [address] [place], [place] [address]    Special Requests   Final    BOTTLES DRAWN AEROBIC AND ANAEROBIC Blood Culture adequate volume Performed at [facility] [place], [address] [place], [place] [address]    Culture  Setup Time   Final    GRAM POSITIVE COCCI IN BOTH AEROBIC AND ANAEROBIC BOTTLES CRITICAL RESULT CALLED TO, READ BACK BY AND VERIFIED WITH: PHARMD T GREEN [id number] MLM Performed  at [facility], [address] [place],  [address]    Culture METHICILLIN RESISTANT STAPHYLOCOCCUS AUREUS (A)  Final   Report Status 06/15/2017 FINAL  Final   Organism ID, Bacteria METHICILLIN RESISTANT STAPHYLOCOCCUS AUREUS  Final      Susceptibility   Methicillin resistant staphylococcus aureus - MIC*    CIPROFLOXACIN >=8 RESISTANT Resistant     ERYTHROMYCIN >=8 RESISTANT Resistant     GENTAMICIN <=0.5 SENSITIVE Sensitive     OXACILLIN >=4 RESISTANT Resistant     TETRACYCLINE <=1 SENSITIVE Sensitive     VANCOMYCIN 1 SENSITIVE Sensitive     TRIMETH/SULFA 160 RESISTANT Resistant     CLINDAMYCIN >=8 RESISTANT Resistant     RIFAMPIN <=0.5 SENSITIVE Sensitive     Inducible Clindamycin NEGATIVE Sensitive     * METHICILLIN RESISTANT STAPHYLOCOCCUS AUREUS  Blood Culture ID Panel (Reflexed)     Status: Abnormal   Collection Time: 06/12/17 11:36 PM  Result Value Ref Range Status   Enterococcus species NOT DETECTED NOT DETECTED Final   Listeria monocytogenes NOT DETECTED NOT DETECTED Final   Staphylococcus species DETECTED (A) NOT DETECTED Final    Comment: CRITICAL RESULT CALLED TO, READ BACK BY AND VERIFIED WITH: PHARMD T GREEN [id number] MLM    Staphylococcus aureus DETECTED (A) NOT DETECTED Final    Comment: Methicillin (oxacillin)-resistant Staphylococcus aureus (MRSA). MRSA is predictably resistant to beta-lactam antibiotics (except ceftaroline). Preferred therapy is vancomycin unless clinically contraindicated. Patient requires contact precautions if  hospitalized. CRITICAL RESULT CALLED TO, READ BACK BY AND VERIFIED WITH: PHARMD T GREEN [id number] MLM    Methicillin resistance DETECTED (A) NOT DETECTED Final    Comment: CRITICAL RESULT CALLED TO, READ BACK BY AND VERIFIED WITH: PHARMD T GREEN [id number] MLM    Streptococcus species NOT DETECTED NOT DETECTED Final   Streptococcus agalactiae NOT DETECTED NOT DETECTED Final   Streptococcus pneumoniae NOT DETECTED  NOT DETECTED Final   Streptococcus pyogenes NOT DETECTED NOT DETECTED Final   Acinetobacter baumannii NOT DETECTED NOT DETECTED Final   Enterobacteriaceae species NOT DETECTED NOT DETECTED Final   Enterobacter cloacae complex NOT DETECTED NOT DETECTED Final   Escherichia coli NOT DETECTED NOT DETECTED Final   Klebsiella oxytoca NOT DETECTED NOT DETECTED Final   Klebsiella pneumoniae NOT DETECTED NOT DETECTED Final   Proteus species NOT DETECTED NOT DETECTED Final   Serratia marcescens NOT DETECTED NOT DETECTED Final   Haemophilus influenzae NOT DETECTED NOT DETECTED Final   Neisseria meningitidis NOT DETECTED NOT DETECTED Final     Pseudomonas aeruginosa NOT DETECTED NOT DETECTED Final   Candida albicans NOT DETECTED NOT DETECTED Final   Candida glabrata NOT DETECTED NOT DETECTED Final   Candida krusei NOT DETECTED NOT DETECTED Final   Candida parapsilosis NOT DETECTED NOT DETECTED Final   Candida tropicalis NOT DETECTED NOT DETECTED Final    Comment: Performed at [facility], [address] [place], [place] [address]  Blood Culture (routine x 2)     Status: Abnormal   Collection Time: 06/13/17 12:30 AM  Result Value Ref Range Status   Specimen Description   Final    BLOOD LEFT ARM UPPER Performed at [facility] [place], [address] [place], [place] [address]    Special Requests   Final    BOTTLES DRAWN AEROBIC AND ANAEROBIC Blood Culture adequate volume Performed at [facility] [place], [address] [place], [place] [address]    Culture  Setup Time   Final    GRAM POSITIVE COCCI IN BOTH AEROBIC AND ANAEROBIC BOTTLES    Culture (A)  Final    STAPHYLOCOCCUS AUREUS SUSCEPTIBILITIES PERFORMED ON PREVIOUS CULTURE WITHIN THE LAST 5 DAYS. Performed at [facility], [address] [place], [place] [address]    Report Status 06/15/2017 FINAL  Final  Culture, blood (Routine X 2) w Reflex to ID Panel     Status: None (Preliminary result)   Collection Time:  06/13/17  7:10 PM  Result Value Ref Range Status   Specimen Description   Final    BLOOD RIGHT FOREARM Performed at [facility], [address] [place], [place] [address]    Special Requests   Final    BOTTLES DRAWN AEROBIC ONLY Blood Culture results may not be optimal due to an inadequate volume of blood received in culture bottles Performed at [facility], [address] [place], [place] [address]    Culture   Final    NO GROWTH 2 DAYS Performed at [facility], [address] [place], [place] [address]    Report Status PENDING  Incomplete  MRSA PCR Screening     Status: None   Collection Time: 06/15/17  8:33 AM  Result Value Ref Range Status   MRSA by PCR NEGATIVE NEGATIVE Final    Comment:        The GeneXpert MRSA Assay (FDA approved for NASAL specimens only), is one component of a comprehensive MRSA colonization surveillance program. It is not intended to diagnose MRSA infection nor to guide or monitor treatment for MRSA infections. Performed at [facility], [address] [place], [place] [address]     Studies/Results: Ct Femur Right W Contrast  Result Date: 06/15/2017 CLINICAL DATA:  Drug abuser with injections in the right thigh. Right thigh pain swelling for 2 weeks. Malaise, fatigue, fever and chills. EXAM: CT OF THE LOWER RIGHT EXTREMITY WITH CONTRAST TECHNIQUE: Multidetector CT imaging of the lower right extremity was performed according to the standard protocol following intravenous contrast administration. COMPARISON:  Plain films right upper leg 06/12/2017. CONTRAST:  100 ml ISOVUE-300 IOPAMIDOL (ISOVUE-300) INJECTION 61% FINDINGS: Bones/Joint/Cartilage Appear normal throughout without fracture, focal lesion, periosteal reaction or bony destructive change. No evidence of arthropathy about the hip or knee. Ligaments Suboptimally assessed by CT. Muscles and Tendons Intact and normal in appearance. Fat planes  within muscle are preserved. No intramuscular abscess is identified. Soft tissues Mild stranding in subcutaneous fat about the thigh is noted.   No focal fluid collection is seen. The patient has a trace knee joint effusion. Small Baker's cyst is noted. Imaged intrapelvic contents demonstrate a small amount of free pelvic fluid consistent with physiologic change. IMPRESSION: Mild stranding in subcutaneous fat about the right upper leg is compatible with cellulitis. Negative for abscess, myositis or osteomyelitis. Small Baker's cyst is incidentally noted. Electronically Signed   By: Thomas  Dalessio M.D.   On: 06/15/2017 07:25      Assessment/Plan:  INTERVAL HISTORY: above complaints noted. CT of thigh without overt abscess  Principal Problem:   Abscess of right leg Active Problems:   Wound infection   H/O bacterial endocarditis   Heroin abuse (HCC)   Hepatitis C   Bipolar 2 disorder (HCC)   Hypokalemia    Gloria Meyer is a 41 y.o. 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  Specific aspects of clinical management are critical to optimizing the outcome of patients with SAB.  Therefore, the [facility] [facility] ([facility]) has initiated an intervention aimed at improving the management of SAB at [place].  To do so, Infectious Diseases physicians are providing an evidence-based consult for the management of all patients with SAB.     Yes No Comments  Perform follow-up blood cultures (even if the patient is afebrile) to ensure clearance of bacteremia [x] [] REPEAT YESTERDAY POST CVL REMOVAL not displaying data yet  Remove vascular catheter and obtain follow-up blood  cultures after the removal of the catheter [x] [] Central line is out  Perform echocardiography to evaluate for endocarditis (transthoracic ECHO is 40-50% sensitive, TEE is > 90% sensitive) [x] [] Please keep in mind, that neither test can definitively EXCLUDE endocarditis, and that should clinical suspicion remain high for endocarditis the patient should then still be treated with an "endocarditis" duration of therapy = 6 weeks  Consult electrophysiologist to evaluate implanted cardiac device (pacemaker, ICD) [] []   Ensure source control [x] [] Have all abscesses been drained effectively? Have deep seeded infections (septic joints or osteomyelitis) had appropriate surgical debridement?  Not clear will have to observe thigh clinically  Investigate for "metastatic" sites of infection [] [] Does the patient have ANY symptom or physical exam finding that would suggest a deeper infection (back or neck pain that may be suggestive of vertebral osteomyelitis or epidural abscess, muscle pain that could be a symptom of pyomyositis)?  Keep in mind that for deep seeded infections MRI imaging with contrast is preferred rather than other often insensitive tests such as plain x-rays, especially early in a patient's presentation.  IF HIP pain persists, would get MRI  Of Left hip with GAD. Her knees and big toe are not esp tender  Change antibiotic therapy to vancomycin [] [] Beta-lactam antibiotics are preferred for MSSA due to higher cure rates.   If on Vancomycin, goal trough should be 15 - 20 mcg/mL  Estimated duration of IV antibiotic therapy:  4-6 weeks depending upon sites found  IF she has PVE she will need RIFAMPIN 300 mg q 8 hours x 6 weeks and Gentamicin IV x 2 weeks  [] [] Consult case management for probably prolonged outpatient IV antibiotic therapy   #  2 IVDU:  Monitor for IVDU in house as able  Needs to get back on treatment  #3 Hep C: would be happy to treat her as outpatient    LOS: 3  days   Gloria Meyer 06/16/2017, 12:46 PM  

## 2017-06-16 NOTE — Interval H&P Note (Signed)
History and Physical Interval Note:  06/16/2017 1:31 PM  Gloria Meyer  has presented today for surgery, with the diagnosis of BACTEREMIA  The various methods of treatment have been discussed with the patient and family. After consideration of risks, benefits and other options for treatment, the patient has consented to  Procedure(s): TRANSESOPHAGEAL ECHOCARDIOGRAM (TEE) (N/A) as a surgical intervention .  The patient's history has been reviewed, patient examined, no change in status, stable for surgery.  I have reviewed the patient's chart and labs.  Questions were answered to the patient's satisfaction.     Kristeen Miss

## 2017-06-16 NOTE — Progress Notes (Signed)
Pharmacy Antibiotic Note  Gloria Meyer is a 42 y.o. female admitted on 06/12/2017 with MRSA bacteremia and thigh abscess.  Patient has a hx of MRSA endocarditis, now acute prosthetic valve endocarditis .  Pharmacy has been consulted for vancomycin dosing. Gentamicin being added with confirmation of endocarditis of prosthetic valve.   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  Renal: SCr WNL  WBC WNL  afebrile  Plan:  Continue vancomycin  IV q12h  Gentamicin  IV q12h (plan for 14 days)  Goal levels (Peak 3-4 mcg/mL and trough < 1 mcg/ml)  Monitor SCr daily  Check steady state vancomycin peak and trough  Rifampin recommended with prosthetic valve endocarditis, but note rifampin will reduced methadone levels and may precipitate withdrawal (and may need to reduce methadone dose when rifampin stopped)  Height:  (165.1 cm) Weight: 138 lb 10.7 oz (62.9 kg) IBW/kg (Calculated) : 57  Temp (24hrs), Avg:99.2 F (37.3 C), Min:97.9 F (36.6 C), Max:100.1 F (37.8 C)  Recent Labs  Lab 06/12/17 2335 06/12/17 2349 06/13/17 1640 06/14/17 0354 06/14/17 1017 06/14/17 1344 06/15/17 1017 06/16/17 0436  WBC 2.8*  --  4.7 4.6  --   --  4.1 4.0  CREATININE 1.21*  --  0.86 0.75  --   --  0.80 0.72  LATICACIDVEN  --  1.69  --   --  1.8 1.6  --   --     Estimated Creatinine Clearance: 84.1 mL/min (by C-G formula based on SCr of 0.72 mg/dL).    Allergies  Allergen Reactions  . Sulfa Antibiotics Rash    Antimicrobials this admission:  5/26 Vanc >> 5/26 Rifampin >> 5/27 5/26 Zosyn >> 5/26 5/29 gent >>  Dose adjustments this admission:  5/28 Vancomycin 1 g IV q24h --> vancomycin 750 mg IV q12h  Microbiology results:  5/25 BCx: MRSA 5/26 BCx: Ngtd  Thank you for allowing pharmacy to be a part of this patient's care.  Juliette Alcide, PharmD, BCPS.   Pager: [phone] 06/16/2017 7:30 PM

## 2017-06-16 NOTE — H&P (View-Only) (Signed)
Subjective: Pt in stretcher to go to [facility]   Antibiotics:  Anti-infectives (From admission, onward)   Start     Dose/Rate Route Frequency Ordered Stop   06/15/17 1100  fluconazole (DIFLUCAN) tablet 150 mg     150 mg Oral  Once 06/15/17 0952 06/15/17 1010   06/15/17 1000  vancomycin (VANCOCIN) IVPB 750 mg/150 ml premix     750 mg 150 mL/hr over 60 Minutes Intravenous Every 12 hours 06/15/17 0749     06/13/17 2200  vancomycin (VANCOCIN) IVPB 1000 mg/200 mL premix  Status:  Discontinued     1,000 mg 200 mL/hr over 60 Minutes Intravenous Every 24 hours 06/13/17 0852 06/15/17 0749   06/13/17 0830  piperacillin-tazobactam (ZOSYN) IVPB 3.375 g  Status:  Discontinued     3.375 g 12.5 mL/hr over 240 Minutes Intravenous Every 8 hours 06/13/17 0823 06/13/17 1555   06/13/17 0030  rifampin (RIFADIN) 300 mg in sodium chloride 0.9 % 100 mL IVPB  Status:  Discontinued     300 mg 200 mL/hr over 30 Minutes Intravenous Every 8 hours 06/13/17 0017 06/14/17 1416   06/13/17 0030  piperacillin-tazobactam (ZOSYN) IVPB 3.375 g     3.375 g 100 mL/hr over 30 Minutes Intravenous  Once 06/13/17 0017 06/13/17 0147   06/13/17 0030  vancomycin (VANCOCIN) IVPB 1000 mg/200 mL premix     1,000 mg 200 mL/hr over 60 Minutes Intravenous  Once 06/13/17 0027 06/13/17 0216      Medications: Scheduled Meds: . buPROPion  300 mg Oral Daily  . clotrimazole  1 Applicatorful Vaginal QHS  . divalproex  125 mg Oral QHS  . enoxaparin (LOVENOX) injection  40 mg Subcutaneous Daily  . feeding supplement (ENSURE ENLIVE)  237 mL Oral TID BM  . methadone  60 mg Oral Daily  . multivitamin with minerals  1 tablet Oral Daily  . potassium chloride  40 mEq Oral BID  . QUEtiapine  50 mg Oral QHS  . sertraline  50 mg Oral Daily  . sodium chloride flush  10-40 mL Intracatheter Q12H   Continuous Infusions: . vancomycin Stopped (06/15/17 2142)   PRN Meds:.acetaminophen **OR** acetaminophen, ibuprofen, ondansetron **OR**  ondansetron (ZOFRAN) IV, sodium chloride flush    Objective: Weight change:   Intake/Output Summary (Last 24 hours) at 06/16/2017 1246 Last data filed at 06/16/2017 0211 Gross per 24 hour  Intake 1886.67 ml  Output -  Net 1886.67 ml   Blood pressure 123/81, pulse 84, temperature 100.1 F (37.8 C), temperature source Oral, resp. rate 20, height  (1.651 m), weight 138 lb 10.7 oz (62.9 kg), SpO2 95 %. Temp:  [98.2 F (36.8 C)-100.1 F (37.8 C)] 100.1 F (37.8 C) (05/29 0510) Pulse Rate:  [71-93] 84 (05/29 0510) Resp:  [18-20] 20 (05/29 0510) BP: (107-123)/(72-81) 123/81 (05/29 0510) SpO2:  [95 %-100 %] 95 % (05/29 0510)  Physical Exam: General: Alert and awake, oriented x3, in stretcher Neuro: nonfocal  CBC:    BMET Recent Labs    06/15/17 1017 06/16/17 0436  NA 136 137  K 3.2* 4.6  CL 104 104  CO2 23 24  GLUCOSE 118* 74  BUN 9 9  CREATININE 0.80 0.72  CALCIUM 8.0* 8.3*     Liver Panel  Recent Labs    06/15/17 1017 06/16/17 0436  PROT 5.6* 6.6  ALBUMIN 2.1* 2.4*  AST 15 20  ALT 13* 14  ALKPHOS 73 85  BILITOT 0.8 0.7  Sedimentation Rate Recent Labs    06/15/17 0425  ESRSEDRATE 28*   C-Reactive Protein Recent Labs    06/15/17 0427  CRP 13.6*    Micro Results: Recent Results (from the past 720 hour(s))  Blood Culture (routine x 2)     Status: Abnormal   Collection Time: 06/12/17 11:36 PM  Result Value Ref Range Status   Specimen Description   Final    BLOOD FEMORAL ARTERY LEFT Performed at [facility], [address] [place], [place] [address]    Special Requests   Final    BOTTLES DRAWN AEROBIC AND ANAEROBIC Blood Culture adequate volume Performed at [facility], [address] [place], [place] [address]    Culture  Setup Time   Final    GRAM POSITIVE COCCI IN BOTH AEROBIC AND ANAEROBIC BOTTLES CRITICAL RESULT CALLED TO, READ BACK BY AND VERIFIED WITH: Anette Riedel GREEN [id number] MLM Performed  at [facility], [address], [place], [place] [address]    Culture METHICILLIN RESISTANT STAPHYLOCOCCUS AUREUS (A)  Final   Report Status 06/15/2017 FINAL  Final   Organism ID, Bacteria METHICILLIN RESISTANT STAPHYLOCOCCUS AUREUS  Final      Susceptibility   Methicillin resistant staphylococcus aureus - MIC*    CIPROFLOXACIN >=8 RESISTANT Resistant     ERYTHROMYCIN >=8 RESISTANT Resistant     GENTAMICIN <=0.5 SENSITIVE Sensitive     OXACILLIN >=4 RESISTANT Resistant     TETRACYCLINE <=1 SENSITIVE Sensitive     VANCOMYCIN 1 SENSITIVE Sensitive     TRIMETH/SULFA 160 RESISTANT Resistant     CLINDAMYCIN >=8 RESISTANT Resistant     RIFAMPIN <=0.5 SENSITIVE Sensitive     Inducible Clindamycin NEGATIVE Sensitive     * METHICILLIN RESISTANT STAPHYLOCOCCUS AUREUS  Blood Culture ID Panel (Reflexed)     Status: Abnormal   Collection Time: 06/12/17 11:36 PM  Result Value Ref Range Status   Enterococcus species NOT DETECTED NOT DETECTED Final   Listeria monocytogenes NOT DETECTED NOT DETECTED Final   Staphylococcus species DETECTED (A) NOT DETECTED Final    Comment: CRITICAL RESULT CALLED TO, READ BACK BY AND VERIFIED WITH: PHARMD T GREEN [id number] MLM    Staphylococcus aureus DETECTED (A) NOT DETECTED Final    Comment: Methicillin (oxacillin)-resistant Staphylococcus aureus (MRSA). MRSA is predictably resistant to beta-lactam antibiotics (except ceftaroline). Preferred therapy is vancomycin unless clinically contraindicated. Patient requires contact precautions if  hospitalized. CRITICAL RESULT CALLED TO, READ BACK BY AND VERIFIED WITH: PHARMD T GREEN [id number] MLM    Methicillin resistance DETECTED (A) NOT DETECTED Final    Comment: CRITICAL RESULT CALLED TO, READ BACK BY AND VERIFIED WITH: PHARMD T GREEN [id number] MLM    Streptococcus species NOT DETECTED NOT DETECTED Final   Streptococcus agalactiae NOT DETECTED NOT DETECTED Final   Streptococcus pneumoniae NOT DETECTED  NOT DETECTED Final   Streptococcus pyogenes NOT DETECTED NOT DETECTED Final   Acinetobacter baumannii NOT DETECTED NOT DETECTED Final   Enterobacteriaceae species NOT DETECTED NOT DETECTED Final   Enterobacter cloacae complex NOT DETECTED NOT DETECTED Final   Escherichia coli NOT DETECTED NOT DETECTED Final   Klebsiella oxytoca NOT DETECTED NOT DETECTED Final   Klebsiella pneumoniae NOT DETECTED NOT DETECTED Final   Proteus species NOT DETECTED NOT DETECTED Final   Serratia marcescens NOT DETECTED NOT DETECTED Final   Haemophilus influenzae NOT DETECTED NOT DETECTED Final   Neisseria meningitidis NOT DETECTED NOT DETECTED Final  Pseudomonas aeruginosa NOT DETECTED NOT DETECTED Final   Candida albicans NOT DETECTED NOT DETECTED Final   Candida glabrata NOT DETECTED NOT DETECTED Final   Candida krusei NOT DETECTED NOT DETECTED Final   Candida parapsilosis NOT DETECTED NOT DETECTED Final   Candida tropicalis NOT DETECTED NOT DETECTED Final    Comment: Performed at [facility], [address], [place], [place] [address]  Blood Culture (routine x 2)     Status: Abnormal   Collection Time: 06/13/17 12:30 AM  Result Value Ref Range Status   Specimen Description   Final    BLOOD LEFT ARM UPPER Performed at [facility], [address] [place], [place] [address]    Special Requests   Final    BOTTLES DRAWN AEROBIC AND ANAEROBIC Blood Culture adequate volume Performed at [facility], [address] [place], [place] [address]    Culture  Setup Time   Final    GRAM POSITIVE COCCI IN BOTH AEROBIC AND ANAEROBIC BOTTLES    Culture (A)  Final    STAPHYLOCOCCUS AUREUS SUSCEPTIBILITIES PERFORMED ON PREVIOUS CULTURE WITHIN THE LAST 5 DAYS. Performed at [facility], [address], [place], [place] [address]    Report Status 06/15/2017 FINAL  Final  Culture, blood (Routine X 2) w Reflex to ID Panel     Status: None (Preliminary result)   Collection Time:  06/13/17  7:10 PM  Result Value Ref Range Status   Specimen Description   Final    BLOOD RIGHT FOREARM Performed at [facility], [address] [place], [place] [address]    Special Requests   Final    BOTTLES DRAWN AEROBIC ONLY Blood Culture results may not be optimal due to an inadequate volume of blood received in culture bottles Performed at [facility], [address], [place], [place] [address]    Culture   Final    NO GROWTH 2 DAYS Performed at [facility], [address] [place], [place] [address]    Report Status PENDING  Incomplete  MRSA PCR Screening     Status: None   Collection Time: 06/15/17  8:33 AM  Result Value Ref Range Status   MRSA by PCR NEGATIVE NEGATIVE Final    Comment:        The GeneXpert MRSA Assay (FDA approved for NASAL specimens only), is one component of a comprehensive MRSA colonization surveillance program. It is not intended to diagnose MRSA infection nor to guide or monitor treatment for MRSA infections. Performed at [facility], [address], [place], [place] [address]     Studies/Results: Ct Femur Right W Contrast  Result Date: 06/15/2017 CLINICAL DATA:  Drug abuser with injections in the right thigh. Right thigh pain swelling for 2 weeks. Malaise, fatigue, fever and chills. EXAM: CT OF THE LOWER RIGHT EXTREMITY WITH CONTRAST TECHNIQUE: Multidetector CT imaging of the lower right extremity was performed according to the standard protocol following intravenous contrast administration. COMPARISON:  Plain films right upper leg 06/12/2017. CONTRAST:  100 ml ISOVUE-300 IOPAMIDOL (ISOVUE-300) INJECTION 61% FINDINGS: Bones/Joint/Cartilage Appear normal throughout without fracture, focal lesion, periosteal reaction or bony destructive change. No evidence of arthropathy about the hip or knee. Ligaments Suboptimally assessed by CT. Muscles and Tendons Intact and normal in appearance. Fat planes  within muscle are preserved. No intramuscular abscess is identified. Soft tissues Mild stranding in subcutaneous fat about the thigh is noted.  No focal fluid collection is seen. The patient has a trace knee joint effusion. Small Baker's cyst is noted. Imaged intrapelvic contents demonstrate a small amount of free pelvic fluid consistent with physiologic change. IMPRESSION: Mild stranding in subcutaneous fat about the right upper leg is compatible with cellulitis. Negative for abscess, myositis or osteomyelitis. Small Baker's cyst is incidentally noted. Electronically Signed   By: Drusilla Kanner M.D.   On: 06/15/2017 07:25      Assessment/Plan:  INTERVAL HISTORY: above complaints noted. CT of thigh without overt abscess  Principal Problem:   Abscess of right leg Active Problems:   Wound infection   H/O bacterial endocarditis   Heroin abuse (HCC)   Hepatitis C   Bipolar 2 disorder (HCC)   Hypokalemia    Gloria Meyer is a 42 y.o. female with  IVDU, Hep C, prosthetic TV (replaced at [facility]) now with MRSA bacteremia/rule out endocarditis from soft tissue infection at injection site in thigh  #1       [facility] Staphylococcus aureus bacteremia   Staphylococcus aureus bacteremia (SAB) is associated with a high rate of complications and mortality.  Specific aspects of clinical management are critical to optimizing the outcome of patients with SAB.  Therefore, the [facility] Antimicrobial Management Team [facility]) has initiated an intervention aimed at improving the management of SAB at [facility].  To do so, Infectious Diseases physicians are providing an evidence-based consult for the management of all patients with SAB.     Yes No Comments  Perform follow-up blood cultures (even if the patient is afebrile) to ensure clearance of bacteremia   REPEAT YESTERDAY POST CVL REMOVAL not displaying data yet  Remove vascular catheter and obtain follow-up blood  cultures after the removal of the catheter   Central line is out  Perform echocardiography to evaluate for endocarditis (transthoracic ECHO is 40-50% sensitive, TEE is > 90% sensitive)   Please keep in mind, that neither test can definitively EXCLUDE endocarditis, and that should clinical suspicion remain high for endocarditis the patient should then still be treated with an "endocarditis" duration of therapy = 6 weeks  Consult electrophysiologist to evaluate implanted cardiac device (pacemaker, ICD)     Ensure source control   Have all abscesses been drained effectively? Have deep seeded infections (septic joints or osteomyelitis) had appropriate surgical debridement?  Not clear will have to observe thigh clinically  Investigate for "metastatic" sites of infection   Does the patient have ANY symptom or physical exam finding that would suggest a deeper infection (back or neck pain that may be suggestive of vertebral osteomyelitis or epidural abscess, muscle pain that could be a symptom of pyomyositis)?  Keep in mind that for deep seeded infections MRI imaging with contrast is preferred rather than other often insensitive tests such as plain x-rays, especially early in a patient's presentation.  IF HIP pain persists, would get MRI  Of Left hip with GAD. Her knees and big toe are not esp tender  Change antibiotic therapy to vancomycin   Beta-lactam antibiotics are preferred for MSSA due to higher cure rates.   If on Vancomycin, goal trough should be 15 - 20 mcg/mL  Estimated duration of IV antibiotic therapy:  4-6 weeks depending upon sites found  IF she has PVE she will need RIFAMPIN 300 mg q 8 hours x 6 weeks and Gentamicin IV x 2 weeks    Consult case management for probably prolonged outpatient IV antibiotic therapy   #  2 IVDU:  Monitor for IVDU in house as able  Needs to get back on treatment  #3 Hep C: would be happy to treat her as outpatient    LOS: 3  days   Acey Lav 06/16/2017, 12:46 PM

## 2017-06-16 NOTE — Anesthesia Preprocedure Evaluation (Signed)
Anesthesia Evaluation  Patient identified by MRN, date of birth, ID band Patient awake    Reviewed: Allergy & Precautions, NPO status , Patient's Chart, lab work & pertinent test results  Airway Mallampati: II  TM Distance: >3 FB     Dental   Pulmonary pneumonia, Current Smoker,    breath sounds clear to auscultation       Cardiovascular  Rhythm:Regular Rate:Normal  History noted. CG   Neuro/Psych    GI/Hepatic negative GI ROS, (+) Hepatitis -  Endo/Other    Renal/GU negative Renal ROS     Musculoskeletal   Abdominal   Peds  Hematology   Anesthesia Other Findings   Reproductive/Obstetrics                             Anesthesia Physical Anesthesia Plan  ASA: III  Anesthesia Plan: General   Post-op Pain Management:    Induction: Intravenous  PONV Risk Score and Plan: Treatment may vary due to age or medical condition  Airway Management Planned: Simple Face Mask and Mask  Additional Equipment:   Intra-op Plan:   Post-operative Plan:   Informed Consent: I have reviewed the patients History and Physical, chart, labs and discussed the procedure including the risks, benefits and alternatives for the proposed anesthesia with the patient or authorized representative who has indicated his/her understanding and acceptance.   Dental advisory given  Plan Discussed with: CRNA and Anesthesiologist  Anesthesia Plan Comments:         Anesthesia Quick Evaluation

## 2017-06-16 NOTE — Transfer of Care (Signed)
Immediate Anesthesia Transfer of Care Note  Patient: Gloria Meyer  Procedure(s) Performed: TRANSESOPHAGEAL ECHOCARDIOGRAM (TEE) (N/A )  Patient Location: Endoscopy Unit  Anesthesia Type:MAC  Level of Consciousness: awake, alert  and oriented  Airway & Oxygen Therapy: Patient Spontanous Breathing and Patient connected to nasal cannula oxygen  Post-op Assessment: Report given to RN, Post -op Vital signs reviewed and stable and Patient moving all extremities  Post vital signs: Reviewed and stable  Last Vitals:  Vitals Value Taken Time  BP 95/66 06/16/2017  2:20 PM  Temp 36.6 C 06/16/2017  2:20 PM  Pulse 81 06/16/2017  2:21 PM  Resp 12 06/16/2017  2:21 PM  SpO2 99 % 06/16/2017  2:21 PM  Vitals shown include unvalidated device data.  Last Pain:  Vitals:   06/16/17 1420  TempSrc: Oral  PainSc: 0-No pain      Patients Stated Pain Goal: 0 (76/72/09 4709)  Complications: No apparent anesthesia complications

## 2017-06-16 NOTE — CV Procedure (Signed)
    Transesophageal Echocardiogram Note  Gloria Meyer [id number] 06/16/1975  Procedure: Transesophageal Echocardiogram Indications: bacteremia  Procedure Details Consent: Obtained Time Out: Verified patient identification, verified procedure, site/side was marked, verified correct patient position, special equipment/implants available, Radiology Safety Procedures followed,  medications/allergies/relevent history reviewed, required imaging and test results available.  Performed  Medications:  During this procedure the patient is administered a Profol drip total of 200 mg IV .   She also received Lidocaine 100 mg IV      Left Ventrical:  Normal LV function, EF 60-65%  Mitral Valve: mild - mod  MR , no vegetation   Aortic Valve:  Normal   Tricuspid Valve: s/p bioprosthetic TVR.   There is a large , complex vegetation involving 2 leaflets .     Pulmonic Valve:  Mild - moderate PI   Left Atrium/ Left atrial appendage: no thrombi   Atrial septum:  No ASD or PFO by color flow   Aorta: normal    Complications: No apparent complications Patient did tolerate procedure well.   Vesta Mixer, Montez Hageman., MD, [facility] 06/16/2017, 2:14 PM

## 2017-06-16 NOTE — Progress Notes (Signed)
  Echocardiogram Echocardiogram Transesophageal has been performed.  Pieter Partridge 06/16/2017, 2:38 PM

## 2017-06-16 NOTE — Plan of Care (Signed)

## 2017-06-16 NOTE — Anesthesia Postprocedure Evaluation (Signed)
Anesthesia Post Note  Patient: Gloria Meyer  Procedure(s) Performed: TRANSESOPHAGEAL ECHOCARDIOGRAM (TEE) (N/A )     Patient location during evaluation: Cath Lab Anesthesia Type: General Level of consciousness: awake Pain management: pain level controlled Vital Signs Assessment: post-procedure vital signs reviewed and stable Respiratory status: spontaneous breathing Cardiovascular status: stable Anesthetic complications: no    Last Vitals:  Vitals:   06/16/17 0510 06/16/17 1258  BP: 123/81 111/75  Pulse: 84 82  Resp: 20 11  Temp: 37.8 C 37.3 C  SpO2: 95% 96%    Last Pain:  Vitals:   06/16/17 1258  TempSrc: Oral  PainSc: 5                  Bellamia Ferch

## 2017-06-17 DIAGNOSIS — F1990 Other psychoactive substance use, unspecified, uncomplicated: Secondary | ICD-10-CM | POA: Diagnosis present

## 2017-06-17 DIAGNOSIS — L03115 Cellulitis of right lower limb: Secondary | ICD-10-CM | POA: Insufficient documentation

## 2017-06-17 DIAGNOSIS — Z452 Encounter for adjustment and management of vascular access device: Secondary | ICD-10-CM

## 2017-06-17 LAB — CREATININE, SERUM: CREATININE: 0.79 mg/dL (ref 0.44–1.00)

## 2017-06-17 MED ORDER — GENTAMICIN SULFATE 40 MG/ML IJ SOLN
100.0000 mg | Freq: Two times a day (BID) | INTRAVENOUS | Status: DC
  Administered 2017-06-17 (×2): 100 mg via INTRAVENOUS
  Filled 2017-06-17 (×4): qty 2.5

## 2017-06-17 MED ORDER — RISPERIDONE 0.25 MG PO TABS
0.2500 mg | ORAL_TABLET | Freq: Every day | ORAL | Status: DC
  Administered 2017-06-17 – 2017-06-23 (×7): 0.25 mg via ORAL
  Filled 2017-06-17 (×7): qty 1

## 2017-06-17 MED ORDER — RIFAMPIN 300 MG PO CAPS
300.0000 mg | ORAL_CAPSULE | Freq: Three times a day (TID) | ORAL | Status: DC
  Administered 2017-06-17 – 2017-06-24 (×21): 300 mg via ORAL
  Filled 2017-06-17 (×24): qty 1

## 2017-06-17 MED ORDER — METHADONE HCL 10 MG/ML PO CONC
60.0000 mg | Freq: Every day | ORAL | Status: DC
  Administered 2017-06-17 – 2017-06-20 (×4): 60 mg via ORAL
  Filled 2017-06-17 (×5): qty 6

## 2017-06-17 NOTE — Progress Notes (Signed)
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   She states that she relapsed about 3 months ago and has been injecting heroin into her right thigh. About 2 weeks ago, she noticed 2 sites of abscess. She lysed it openwith a Narcan needle at home and drained white pus that was mixed with brown. Then about 5 days ago, she started to feel ill with general malaise, fatigue, fever, chills, sweats. She also admits to productive cough of yellow sputum. She denies any chest pain. Has some nausea and has not been able to keep down any food for the past 5 days.    Subjective: Coughing up specks of blood still. States Methadone tabs wear off by early AM and thinks the liquid will be better for her. Agrees to go to SNF if needed for IV antibiotics.    Assessment & Plan:   Principal Problem:   Acute bacterial endocarditis/  MRSA bacteremia/  Abscess of right leg/ Heroin abuse - TEE - shows infected Tricuspid bioprosthesis with a large vegetation - cont anibiotics per ID- Rifampin TID to be added today - left thigh cellulitis has improved- no abscess noted on CT  Active Problems:  IV Heorine abuse - cont Methadone       Hepatitis C - has not received treatment for this  Bipolar disorder  -Continue with Bupropion 300 mg p.o. daily as well as 50 mg of Quetiapine, Divalproex 125 mg po qHS and Sertraline 50 mg po Daily -Quetiapine being changed back to Risperdal due to interaction with Rifampin- she was taking Risperdal at home and prefers this.    Acute thrombocytopenia - due to sepsis- follow  Candida vaginitis - Fluconazole x 1 and then Clotrimazole ordered by Dr Marland Mcalpine  Chronic grade 2 dCHF - stable    DVT prophylaxis: Lovenox Code Status: full code Family Communication:  Disposition Plan: follow on tele Consultants:   ID  Cardiology (for TEE)  Procedures:   2 d ECHO Study Conclusions  - Left ventricle: The cavity size was normal. Wall thickness was normal. Systolic function was normal. The estimated ejection fraction was in the range of 60% to 65%. Wall motion was normal; there were no regional wall motion abnormalities. Features are consistent with a pseudonormal left ventricular filling pattern, with concomitant abnormal relaxation and increased filling pressure (grade 2 diastolic dysfunction). - Tricuspid valve: A bioprosthesis was present.  Impressions:  - Normal LV systolic function; moderate diastolic dysfunction; s/p TVR.   TEE 5/29 Antimicrobials:  Anti-infectives (From admission, onward)   Start     Dose/Rate Route Frequency Ordered Stop   06/16/17 2200  gentamicin (GARAMYCIN) IVPB 100 mg     100 mg 200 mL/hr over 30 Minutes Intravenous Every 12 hours 06/16/17 1931     06/15/17 1100  fluconazole (DIFLUCAN) tablet 150 mg     150 mg Oral  Once 06/15/17 0952 06/15/17 1010   06/15/17 1000  vancomycin (VANCOCIN) IVPB 750 mg/150 ml premix     750 mg 150 mL/hr over 60 Minutes Intravenous Every 12 hours 06/15/17 0749     06/13/17 2200  vancomycin (VANCOCIN) IVPB 1000 mg/200  mL premix  Status:  Discontinued     1,000 mg 200 mL/hr over 60 Minutes Intravenous Every 24 hours 06/13/17 0852 06/15/17 0749   06/13/17 0830  piperacillin-tazobactam (ZOSYN) IVPB 3.375 g  Status:  Discontinued     3.375 g 12.5 mL/hr over 240 Minutes Intravenous Every 8 hours 06/13/17 0823 06/13/17 1555   06/13/17 0030  rifampin (RIFADIN) 300 mg in sodium chloride 0.9 % 100 mL IVPB  Status:  Discontinued      300 mg 200 mL/hr over 30 Minutes Intravenous Every 8 hours 06/13/17 0017 06/14/17 1416   06/13/17 0030  piperacillin-tazobactam (ZOSYN) IVPB 3.375 g     3.375 g 100 mL/hr over 30 Minutes Intravenous  Once 06/13/17 0017 06/13/17 0147   06/13/17 0030  vancomycin (VANCOCIN) IVPB 1000 mg/200 mL premix     1,000 mg 200 mL/hr over 60 Minutes Intravenous  Once 06/13/17 0027 06/13/17 0216       Objective: Vitals:   06/16/17 1430 06/16/17 1439 06/16/17 2114 06/17/17 0624  BP: (!) 88/58 (!) 96/55 106/73 102/72  Pulse: 70 74 63 64  Resp: Temp:   98.2 F (36.8 C) 98.2 F (36.8 C)  TempSrc:   Oral Oral  SpO2: 97% 99% 100% 98%  Weight:      Height:        Intake/Output Summary (Last 24 hours) at 06/17/2017 0915 Last data filed at 06/17/2017 0644 Gross per 24 hour  Intake 520 ml  Output -  Net 520 ml   Filed Weights   06/12/17 2142 06/13/17 0635 06/16/17 1258  Weight: 61.6 kg (135 lb 11.2 oz) 62.9 kg (138 lb 10.7 oz) 62.9 kg (138 lb 10.7 oz)    Examination: General exam: Appears comfortable  HEENT: PERRLA, oral mucosa moist, no sclera icterus or thrush Respiratory system: Clear to auscultation. Respiratory effort normal. Cardiovascular system: S1 & S2 heard, RRR.   Gastrointestinal system: Abdomen soft, non-tender, nondistended. Normal bowel sound. No organomegaly Central nervous system: Alert and oriented. No focal neurological deficits. Extremities: No cyanosis, clubbing or edema Skin: numerous scabs and bruises all over her body Psychiatry:  Flat affect   Data Reviewed: I have personally reviewed following labs and imaging studies  CBC: Recent Labs  Lab 06/12/17 2335 06/13/17 1640 06/14/17 0354 06/15/17 1017 06/16/17 0436  WBC 2.8* 4.7 4.6 4.1 4.0  NEUTROABS 2.4  --   --  2.7 2.9  HGB 12.5 11.8* 10.6* 10.1* 11.8*  HCT 35.9* 35.6* 31.8* 30.5* 35.9*  MCV 90.2 92.7 91.4 92.4 91.8  PLT 79* 52* 48* 56* 71*   Basic Metabolic Panel: Recent Labs  Lab  06/12/17 2335 06/13/17 1640 06/14/17 0354 06/15/17 1017 06/16/17 0436 06/16/17 0504 06/17/17 0437  NA 130*  --  134* 136 137  --   --   K 3.0*  --  3.0* 3.2* 4.6  --   --   CL 96*  --  101 104 104  --   --   CO2 24  --  --   --   GLUCOSE 93  --  132* 118* 74  --   --   BUN 19  --  --   --   CREATININE 1.21* 0.86 0.75 0.80 0.72  --  0.79  CALCIUM 8.2*  --  8.2* 8.0* 8.3*  --   --   MG  --  2.0  --  1.8 1.8  --   --  PHOS  --   --   --  4.2  --  3.8  --    GFR: Estimated Creatinine Clearance: 84.1 mL/min (by C-G formula based on SCr of 0.79 mg/dL). Liver Function Tests: Recent Labs  Lab 06/12/17 2335 06/15/17 1017 06/16/17 0436  AST 30 15 20   ALT 20 13* 14  ALKPHOS 106 73 85  BILITOT 1.3* 0.8 0.7  PROT 6.5 5.6* 6.6  ALBUMIN 2.8* 2.1* 2.4*   No results for input(s): LIPASE, AMYLASE in the last 168 hours. No results for input(s): AMMONIA in the last 168 hours. Coagulation Profile: No results for input(s): INR, PROTIME in the last 168 hours. Cardiac Enzymes: Recent Labs  Lab 06/12/17 2335  TROPONINI <0.03   BNP (last 3 results) No results for input(s): PROBNP in the last 8760 hours. HbA1C: No results for input(s): HGBA1C in the last 72 hours. CBG: No results for input(s): GLUCAP in the last 168 hours. Lipid Profile: No results for input(s): CHOL, HDL, LDLCALC, TRIG, CHOLHDL, LDLDIRECT in the last 72 hours. Thyroid Function Tests: No results for input(s): TSH, T4TOTAL, FREET4, T3FREE, THYROIDAB in the last 72 hours. Anemia Panel: No results for input(s): VITAMINB12, FOLATE, FERRITIN, TIBC, IRON, RETICCTPCT in the last 72 hours. Urine analysis:    Component Value Date/Time   COLORURINE YELLOW 06/13/2017 0540   APPEARANCEUR CLOUDY (A) 06/13/2017 0540   LABSPEC <1.005 (L) 06/13/2017 0540   PHURINE 6.5 06/13/2017 0540   GLUCOSEU NEGATIVE 06/13/2017 0540   HGBUR MODERATE (A) 06/13/2017 0540   BILIRUBINUR NEGATIVE 06/13/2017 0540   KETONESUR  NEGATIVE 06/13/2017 0540   PROTEINUR NEGATIVE 06/13/2017 0540   NITRITE NEGATIVE 06/13/2017 0540   LEUKOCYTESUR NEGATIVE 06/13/2017 0540   Sepsis Labs: @LABRCNTIP (procalcitonin:4,lacticidven:4) ) Recent Results (from the past 240 hour(s))  Blood Culture (routine x 2)     Status: Abnormal   Collection Time: 06/12/17 11:36 PM  Result Value Ref Range Status   Specimen Description   Final    BLOOD FEMORAL ARTERY LEFT Performed at [facility], [address] [place], [place] [address]    Special Requests   Final    BOTTLES DRAWN AEROBIC AND ANAEROBIC Blood Culture adequate volume Performed at [facility], [address] [place], [place] [address]    Culture  Setup Time   Final    GRAM POSITIVE COCCI IN BOTH AEROBIC AND ANAEROBIC BOTTLES CRITICAL RESULT CALLED TO, READ BACK BY AND VERIFIED WITH: Anette Riedel GREEN [id number] MLM Performed at [facility], [address] [place], [place] [address]    Culture METHICILLIN RESISTANT STAPHYLOCOCCUS AUREUS (A)  Final   Report Status 06/15/2017 FINAL  Final   Organism ID, Bacteria METHICILLIN RESISTANT STAPHYLOCOCCUS AUREUS  Final      Susceptibility   Methicillin resistant staphylococcus aureus - MIC*    CIPROFLOXACIN >=8 RESISTANT Resistant     ERYTHROMYCIN >=8 RESISTANT Resistant     GENTAMICIN <=0.5 SENSITIVE Sensitive     OXACILLIN >=4 RESISTANT Resistant     TETRACYCLINE <=1 SENSITIVE Sensitive     VANCOMYCIN 1 SENSITIVE Sensitive     TRIMETH/SULFA 160 RESISTANT Resistant     CLINDAMYCIN >=8 RESISTANT Resistant     RIFAMPIN <=0.5 SENSITIVE Sensitive     Inducible Clindamycin NEGATIVE Sensitive     * METHICILLIN RESISTANT STAPHYLOCOCCUS AUREUS  Blood Culture ID Panel (Reflexed)     Status: Abnormal   Collection Time: 06/12/17 11:36 PM  Result Value Ref Range Status  Enterococcus species NOT DETECTED NOT DETECTED Final   Listeria monocytogenes NOT DETECTED NOT DETECTED Final   Staphylococcus  species DETECTED (A) NOT DETECTED Final    Comment: CRITICAL RESULT CALLED TO, READ BACK BY AND VERIFIED WITH: PHARMD T GREEN [id number] MLM    Staphylococcus aureus DETECTED (A) NOT DETECTED Final    Comment: Methicillin (oxacillin)-resistant Staphylococcus aureus (MRSA). MRSA is predictably resistant to beta-lactam antibiotics (except ceftaroline). Preferred therapy is vancomycin unless clinically contraindicated. Patient requires contact precautions if  hospitalized. CRITICAL RESULT CALLED TO, READ BACK BY AND VERIFIED WITH: PHARMD T GREEN [id number] MLM    Methicillin resistance DETECTED (A) NOT DETECTED Final    Comment: CRITICAL RESULT CALLED TO, READ BACK BY AND VERIFIED WITH: PHARMD T GREEN [id number] MLM    Streptococcus species NOT DETECTED NOT DETECTED Final   Streptococcus agalactiae NOT DETECTED NOT DETECTED Final   Streptococcus pneumoniae NOT DETECTED NOT DETECTED Final   Streptococcus pyogenes NOT DETECTED NOT DETECTED Final   Acinetobacter baumannii NOT DETECTED NOT DETECTED Final   Enterobacteriaceae species NOT DETECTED NOT DETECTED Final   Enterobacter cloacae complex NOT DETECTED NOT DETECTED Final   Escherichia coli NOT DETECTED NOT DETECTED Final   Klebsiella oxytoca NOT DETECTED NOT DETECTED Final   Klebsiella pneumoniae NOT DETECTED NOT DETECTED Final   Proteus species NOT DETECTED NOT DETECTED Final   Serratia marcescens NOT DETECTED NOT DETECTED Final   Haemophilus influenzae NOT DETECTED NOT DETECTED Final   Neisseria meningitidis NOT DETECTED NOT DETECTED Final   Pseudomonas aeruginosa NOT DETECTED NOT DETECTED Final   Candida albicans NOT DETECTED NOT DETECTED Final   Candida glabrata NOT DETECTED NOT DETECTED Final   Candida krusei NOT DETECTED NOT DETECTED Final   Candida parapsilosis NOT DETECTED NOT DETECTED Final   Candida tropicalis NOT DETECTED NOT DETECTED Final    Comment: Performed at [facility], [address] [place], [place]  [address]  Blood Culture (routine x 2)     Status: Abnormal   Collection Time: 06/13/17 12:30 AM  Result Value Ref Range Status   Specimen Description   Final    BLOOD LEFT ARM UPPER Performed at [facility], [address] [place], [place] [address]    Special Requests   Final    BOTTLES DRAWN AEROBIC AND ANAEROBIC Blood Culture adequate volume Performed at [facility], [address] [place], [place] [address]    Culture  Setup Time   Final    GRAM POSITIVE COCCI IN BOTH AEROBIC AND ANAEROBIC BOTTLES    Culture (A)  Final    STAPHYLOCOCCUS AUREUS SUSCEPTIBILITIES PERFORMED ON PREVIOUS CULTURE WITHIN THE LAST 5 DAYS. Performed at [facility], [address] [place], [place] [address]    Report Status 06/15/2017 FINAL  Final  Culture, blood (Routine X 2) w Reflex to ID Panel     Status: None (Preliminary result)   Collection Time: 06/13/17  7:10 PM  Result Value Ref Range Status   Specimen Description   Final    BLOOD RIGHT FOREARM Performed at [facility], [address] [place], [place] [address]    Special Requests   Final    BOTTLES DRAWN AEROBIC ONLY Blood Culture results may not be optimal due to an inadequate volume of blood received in culture bottles Performed at [facility], [address] [place], [place] [address]    Culture   Final  NO GROWTH 3 DAYS Performed at [facility], [address], [place], [place] [address]    Report Status PENDING  Incomplete  MRSA PCR Screening     Status: None   Collection Time: 06/15/17  8:33 AM  Result Value Ref Range Status   MRSA by PCR NEGATIVE NEGATIVE Final    Comment:        The GeneXpert MRSA Assay (FDA approved for NASAL specimens only), is one component of a comprehensive MRSA colonization surveillance program. It is not intended to diagnose MRSA infection nor to guide or monitor treatment for MRSA infections. Performed at [facility], [address] [place], [place] [address]   Culture, blood (Routine X 2) w Reflex to ID Panel     Status: None (Preliminary result)   Collection Time: 06/15/17 10:17 AM  Result Value Ref Range Status   Specimen Description   Final    BLOOD LEFT FOREARM Performed at [facility], [address], [place], [place] [address]    Special Requests   Final    BOTTLES DRAWN AEROBIC ONLY Blood Culture results may not be optimal due to an inadequate volume of blood received in culture bottles Performed at [facility], [address], [place], [place] [address]    Culture   Final    NO GROWTH 1 DAY Performed at [facility], [address] [place], [place] [address]    Report Status PENDING  Incomplete  Culture, blood (Routine X 2) w Reflex to ID Panel     Status: None (Preliminary result)   Collection Time: 06/15/17 10:17 AM  Result Value Ref Range Status   Specimen Description   Final    BLOOD LEFT HAND Performed at [facility], [address], [place], [place] [address]    Special Requests   Final    BOTTLES DRAWN AEROBIC ONLY Blood Culture adequate volume Performed at [facility], [address], [place], [place] [address]    Culture   Final    NO GROWTH 1 DAY Performed at [facility], [address] [place], [place] [address]    Report Status PENDING  Incomplete         Radiology Studies: No results found.    Scheduled Meds: . buPROPion  300 mg Oral Daily  . clotrimazole  1 Applicatorful Vaginal QHS  . divalproex  125 mg Oral QHS  . enoxaparin (LOVENOX) injection  40 mg Subcutaneous Daily  . feeding supplement (ENSURE ENLIVE)  237 mL Oral TID BM  . methadone  60 mg Oral Daily  . multivitamin with minerals  1 tablet Oral Daily  . risperiDONE  0.25 mg Oral QHS  . sertraline  50 mg Oral Daily  . sodium chloride flush  10-40 mL Intracatheter Q12H   Continuous  Infusions: . gentamicin Stopped (06/16/17 2239)  . vancomycin Stopped (06/17/17 0024)     LOS: 4 days    Time spent in minutes: 35    Calvert Cantor, MD [facility] Pager: [email or web address] Password [email or web address] 06/17/2017, 9:15 AM

## 2017-06-17 NOTE — Progress Notes (Signed)
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 is infected   Antibiotics:  Anti-infectives (From admission, onward)   Start     Dose/Rate Route Frequency Ordered Stop   06/17/17 1000  rifampin (RIFADIN) capsule 300 mg     300 mg Oral 3 times daily 06/17/17 0916 07/29/17 0959   06/17/17 1000  gentamicin (GARAMYCIN) 100 mg in dextrose 5 % 50 mL IVPB     100 mg 105 mL/hr over 30 Minutes Intravenous Every 12 hours 06/17/17 0934     06/16/17 2200  gentamicin (GARAMYCIN) IVPB 100 mg  Status:  Discontinued     100 mg 200 mL/hr over 30 Minutes Intravenous Every 12 hours 06/16/17 1931 06/17/17 0934   06/15/17 1100  fluconazole (DIFLUCAN) tablet 150 mg     150 mg Oral  Once 06/15/17 0952 06/15/17 1010   06/15/17 1000  vancomycin (VANCOCIN) IVPB 750 mg/150 ml premix     750 mg 150 mL/hr over 60 Minutes Intravenous Every 12 hours 06/15/17 0749     06/13/17 2200  vancomycin (VANCOCIN) IVPB 1000 mg/200 mL premix  Status:  Discontinued     1,000 mg 200 mL/hr over 60 Minutes Intravenous Every 24 hours 06/13/17 0852 06/15/17 0749   06/13/17 0830  piperacillin-tazobactam (ZOSYN) IVPB 3.375 g  Status:  Discontinued     3.375 g 12.5 mL/hr over 240 Minutes Intravenous Every 8 hours 06/13/17 0823 06/13/17 1555   06/13/17 0030  rifampin (RIFADIN) 300 mg in sodium chloride 0.9 % 100 mL IVPB  Status:  Discontinued     300 mg 200 mL/hr over 30 Minutes Intravenous Every 8 hours 06/13/17 0017 06/14/17 1416   06/13/17 0030  piperacillin-tazobactam (ZOSYN) IVPB 3.375 g     3.375 g 100 mL/hr over 30 Minutes Intravenous  Once 06/13/17 0017 06/13/17 0147   06/13/17 0030  vancomycin (VANCOCIN) IVPB 1000 mg/200 mL premix     1,000 mg 200 mL/hr over 60 Minutes Intravenous  Once 06/13/17 0027 06/13/17 0216      Medications: Scheduled Meds: . buPROPion  300 mg Oral Daily  . clotrimazole  1 Applicatorful Vaginal QHS  . divalproex  125 mg Oral QHS  .  enoxaparin (LOVENOX) injection  40 mg Subcutaneous Daily  . feeding supplement (ENSURE ENLIVE)  237 mL Oral TID BM  . methadone  60 mg Oral Daily  . multivitamin with minerals  1 tablet Oral Daily  . rifampin  300 mg Oral TID  . risperiDONE  0.25 mg Oral QHS  . sertraline  50 mg Oral Daily  . sodium chloride flush  10-40 mL Intracatheter Q12H   Continuous Infusions: . gentamicin Stopped (06/17/17 1100)  . vancomycin Stopped (06/17/17 1130)   PRN Meds:.acetaminophen **OR** acetaminophen, ibuprofen, ondansetron **OR** ondansetron (ZOFRAN) IV, sodium chloride flush    Objective: Weight change:   Intake/Output Summary (Last 24 hours) at 06/17/2017 1714 Last data filed at 06/17/2017 1245 Gross per 24 hour  Intake 812.5 ml  Output -  Net 812.5 ml   Blood pressure 115/71, pulse 83, temperature 98 F (36.7 C), temperature source Oral, resp. rate (!) 24, height  (1.651 m), weight 138 lb 10.7 oz (62.9 kg), SpO2 95 %. Temp:  [98 F (36.7 C)-98.2 F (36.8 C)] 98 F (36.7 C) (05/30 1242) Pulse Rate:  [63-83] 83 (05/30 1242) Resp:  [18-24] 24 (05/30 1242) BP: (102-115)/(71-73) 115/71 (05/30 1242) SpO2:  [95 %-  100 %] 95 % (05/30 1242)  Physical Exam: General: Alert and awake, oriented x3 CV: rrr, no mgr Pulm: clear  GI: soft ND Right thigh with induration around her injection site Neuro: nonfocal  CBC:    BMET Recent Labs    06/15/17 1017 06/16/17 0436 06/17/17 0437  NA 136 137  --   K 3.2* 4.6  --   CL 104 104  --   CO2 23 24  --   GLUCOSE 118* 74  --   BUN 9 9  --   CREATININE 0.80 0.72 0.79  CALCIUM 8.0* 8.3*  --      Liver Panel  Recent Labs    06/15/17 1017 06/16/17 0436  PROT 5.6* 6.6  ALBUMIN 2.1* 2.4*  AST 15 20  ALT 13* 14  ALKPHOS 73 85  BILITOT 0.8 0.7       Sedimentation Rate Recent Labs    06/15/17 0425  ESRSEDRATE 28*   C-Reactive Protein Recent Labs    06/15/17 0427  CRP 13.6*    Micro Results: Recent Results (from  the past 720 hour(s))  Blood Culture (routine x 2)     Status: Abnormal   Collection Time: 06/12/17 11:36 PM  Result Value Ref Range Status   Specimen Description   Final    BLOOD FEMORAL ARTERY LEFT Performed at [facility], [address] [place], [place] [address]    Special Requests   Final    BOTTLES DRAWN AEROBIC AND ANAEROBIC Blood Culture adequate volume Performed at [facility], [address] [place], [place] [address]    Culture  Setup Time   Final    GRAM POSITIVE COCCI IN BOTH AEROBIC AND ANAEROBIC BOTTLES CRITICAL RESULT CALLED TO, READ BACK BY AND VERIFIED WITH: Anette Riedel GREEN [id number] MLM Performed at [facility], [address], [place], [place] [address]    Culture METHICILLIN RESISTANT STAPHYLOCOCCUS AUREUS (A)  Final   Report Status 06/15/2017 FINAL  Final   Organism ID, Bacteria METHICILLIN RESISTANT STAPHYLOCOCCUS AUREUS  Final      Susceptibility   Methicillin resistant staphylococcus aureus - MIC*    CIPROFLOXACIN >=8 RESISTANT Resistant     ERYTHROMYCIN >=8 RESISTANT Resistant     GENTAMICIN <=0.5 SENSITIVE Sensitive     OXACILLIN >=4 RESISTANT Resistant     TETRACYCLINE <=1 SENSITIVE Sensitive     VANCOMYCIN 1 SENSITIVE Sensitive     TRIMETH/SULFA 160 RESISTANT Resistant     CLINDAMYCIN >=8 RESISTANT Resistant     RIFAMPIN <=0.5 SENSITIVE Sensitive     Inducible Clindamycin NEGATIVE Sensitive     * METHICILLIN RESISTANT STAPHYLOCOCCUS AUREUS  Blood Culture ID Panel (Reflexed)     Status: Abnormal   Collection Time: 06/12/17 11:36 PM  Result Value Ref Range Status   Enterococcus species NOT DETECTED NOT DETECTED Final   Listeria monocytogenes NOT DETECTED NOT DETECTED Final   Staphylococcus species DETECTED (A) NOT DETECTED Final    Comment: CRITICAL RESULT CALLED TO, READ BACK BY AND VERIFIED WITH: PHARMD T GREEN [id number] MLM    Staphylococcus aureus DETECTED (A) NOT DETECTED Final    Comment: Methicillin  (oxacillin)-resistant Staphylococcus aureus (MRSA). MRSA is predictably resistant to beta-lactam antibiotics (except ceftaroline). Preferred therapy is vancomycin unless clinically contraindicated. Patient requires contact precautions if  hospitalized. CRITICAL RESULT CALLED TO, READ BACK BY AND VERIFIED WITH: PHARMD T GREEN [id number] MLM    Methicillin resistance DETECTED (A) NOT  DETECTED Final    Comment: CRITICAL RESULT CALLED TO, READ BACK BY AND VERIFIED WITH: PHARMD T GREEN [id number] MLM    Streptococcus species NOT DETECTED NOT DETECTED Final   Streptococcus agalactiae NOT DETECTED NOT DETECTED Final   Streptococcus pneumoniae NOT DETECTED NOT DETECTED Final   Streptococcus pyogenes NOT DETECTED NOT DETECTED Final   Acinetobacter baumannii NOT DETECTED NOT DETECTED Final   Enterobacteriaceae species NOT DETECTED NOT DETECTED Final   Enterobacter cloacae complex NOT DETECTED NOT DETECTED Final   Escherichia coli NOT DETECTED NOT DETECTED Final   Klebsiella oxytoca NOT DETECTED NOT DETECTED Final   Klebsiella pneumoniae NOT DETECTED NOT DETECTED Final   Proteus species NOT DETECTED NOT DETECTED Final   Serratia marcescens NOT DETECTED NOT DETECTED Final   Haemophilus influenzae NOT DETECTED NOT DETECTED Final   Neisseria meningitidis NOT DETECTED NOT DETECTED Final   Pseudomonas aeruginosa NOT DETECTED NOT DETECTED Final   Candida albicans NOT DETECTED NOT DETECTED Final   Candida glabrata NOT DETECTED NOT DETECTED Final   Candida krusei NOT DETECTED NOT DETECTED Final   Candida parapsilosis NOT DETECTED NOT DETECTED Final   Candida tropicalis NOT DETECTED NOT DETECTED Final    Comment: Performed at [facility], [address] [place], [place] [address]  Blood Culture (routine x 2)     Status: Abnormal   Collection Time: 06/13/17 12:30 AM  Result Value Ref Range Status   Specimen Description   Final    BLOOD LEFT ARM UPPER Performed at [facility], [address] [place] [address] [place], [place] [address]    Special Requests   Final    BOTTLES DRAWN AEROBIC AND ANAEROBIC Blood Culture adequate volume Performed at [facility], [address] [place], [place] [address]    Culture  Setup Time   Final    GRAM POSITIVE COCCI IN BOTH AEROBIC AND ANAEROBIC BOTTLES    Culture (A)  Final    STAPHYLOCOCCUS AUREUS SUSCEPTIBILITIES PERFORMED ON PREVIOUS CULTURE WITHIN THE LAST 5 DAYS. Performed at [facility], [address], [place], [place] [address]    Report Status 06/15/2017 FINAL  Final  Culture, blood (Routine X 2) w Reflex to ID Panel     Status: None (Preliminary result)   Collection Time: 06/13/17  7:10 PM  Result Value Ref Range Status   Specimen Description   Final    BLOOD RIGHT FOREARM Performed at [facility], [address], [place], [place] [address]    Special Requests   Final    BOTTLES DRAWN AEROBIC ONLY Blood Culture results may not be optimal due to an inadequate volume of blood received in culture bottles Performed at [facility], [address] [place], [place] [address]    Culture   Final    NO GROWTH 4 DAYS Performed at [facility], [address] [place], [place] [address]    Report Status PENDING  Incomplete  MRSA PCR Screening     Status: None   Collection Time: 06/15/17  8:33 AM  Result Value Ref Range Status   MRSA by PCR NEGATIVE NEGATIVE Final    Comment:        The GeneXpert MRSA Assay (FDA approved for NASAL specimens only), is one component of a comprehensive MRSA colonization surveillance program. It is not intended to diagnose MRSA infection nor to guide or monitor treatment for MRSA infections. Performed at [facility], [address] [place], [place] [address]   Culture, blood (Routine X 2) w Reflex to ID Panel     Status: None (Preliminary result)   Collection Time: 06/15/17 10:17 AM  Result Value Ref Range Status    Specimen Description   Final    BLOOD LEFT FOREARM Performed at [facility], [address], [place], [place] [address]    Special Requests   Final    BOTTLES DRAWN AEROBIC ONLY Blood Culture results may not be optimal due to an inadequate volume of blood received in culture bottles Performed at [facility], [address], [place], [place] [address]    Culture   Final    NO GROWTH 2 DAYS Performed at [facility], [address] [place], [place] [address]    Report Status PENDING  Incomplete  Culture, blood (Routine X 2) w Reflex to ID Panel     Status: None (Preliminary result)   Collection Time: 06/15/17 10:17 AM  Result Value Ref Range Status   Specimen Description   Final    BLOOD LEFT HAND Performed at [facility], [address], [place], [place] [address]    Special Requests   Final    BOTTLES DRAWN AEROBIC ONLY Blood Culture adequate volume Performed at [facility], [address], [place], [place] [address]    Culture   Final    NO GROWTH 2 DAYS Performed at [facility], [address], [place], [place] [address]    Report Status PENDING  Incomplete    Studies/Results: No results found.    Assessment/Plan:  INTERVAL HISTORY: above complaints noted. CT of thigh without overt abscess  Principal Problem:   Acute bacterial endocarditis Active Problems:   Wound infection   Abscess of right leg   H/O bacterial endocarditis   Heroin abuse (HCC)   Hepatitis C   Bipolar 2 disorder (HCC)   Hypokalemia   MRSA bacteremia    Gloria Meyer is a 42 y.o. female with  IVDU, Hep C, prosthetic TV (replaced at [facility]) now with MRSA bacteremia/rule out endocarditis from soft tissue infection at injection site in thigh  #1 See #2 but also SHE NEEDS CVTS CONSULT GIVEN COMPLEX vegetation on TV  I doubt they would want to operate on her but she deserves a consult.  #2       [facility] Antimicrobial Management Team Staphylococcus aureus bacteremia   Staphylococcus aureus bacteremia (SAB) is associated with a high rate of complications and mortality.  Specific aspects of clinical management are critical to optimizing the outcome of patients with SAB.  Therefore, the [facility] Antimicrobial Management Team [facility]) has initiated an intervention aimed at improving the management of SAB at [facility]  To do so, Infectious Diseases physicians are providing an evidence-based consult for the management of all patients with SAB.     Yes No Comments  Perform follow-up blood cultures (even if the patient is afebrile) to ensure clearance of bacteremia   REPEAT  POST CVL REMOVAL NO GROWTH  Remove vascular catheter and obtain follow-up blood cultures after the removal of the catheter   Central line is out  Perform echocardiography to evaluate for endocarditis (transthoracic ECHO is 40-50% sensitive, TEE is > 90% sensitive)   Please keep in mind, that neither test can definitively EXCLUDE endocarditis, and that should clinical suspicion remain high for endocarditis the patient should then still be treated  with an "endocarditis" duration of therapy = 6 weeks  TEE + for complex vegetation  Consult electrophysiologist to evaluate implanted cardiac device (pacemaker, ICD)     Ensure source control   Have all abscesses been drained effectively? Have deep seeded infections (septic joints or osteomyelitis) had appropriate surgical debridement?  Not clear will have to observe thigh clinically  Investigate for "metastatic" sites of infection   Will order MRI left hip for tomorrow  Change antibiotic therapy to vancomycin   Beta-lactam antibiotics are preferred for MSSA due to higher cure rates.   If on Vancomycin, goal trough should be 15 - 20 mcg/mL  Estimated duration of IV antibiotic therapy:  Vancomycin and RIFAMPIN x 6 weeks with Gentamicin IV x 2 weeks     Consult case management for probably prolonged outpatient IV antibiotic therapy   #3  IVDU:  Monitor for IVDU in house as able  Needs to get back on treatment. She seemed very lucid and sober today re her relapse  She needs to get into treatment. She needs to NOT be with boyfriend who was using IVDU as well and learn to stop self medicating with heroine  #3 Hep C: would be happy to treat her as outpatient  I spent greater than 35 minutes with the patient including greater than 50% of time in face to face counsel of the patient re her iVDU, dangers of Infectious Endocarditis  and in coordination of their care.    LOS: 4 days   Acey Lav 06/17/2017, 5:14 PM

## 2017-06-17 NOTE — Progress Notes (Signed)
Will continue to follow for discharge plan.

## 2017-06-18 ENCOUNTER — Inpatient Hospital Stay (HOSPITAL_COMMUNITY): Payer: Self-pay

## 2017-06-18 ENCOUNTER — Encounter (HOSPITAL_COMMUNITY): Payer: Self-pay

## 2017-06-18 ENCOUNTER — Inpatient Hospital Stay: Payer: Self-pay

## 2017-06-18 DIAGNOSIS — I38 Endocarditis, valve unspecified: Secondary | ICD-10-CM

## 2017-06-18 DIAGNOSIS — T826XXD Infection and inflammatory reaction due to cardiac valve prosthesis, subsequent encounter: Secondary | ICD-10-CM

## 2017-06-18 LAB — CULTURE, BLOOD (ROUTINE X 2): CULTURE: NO GROWTH

## 2017-06-18 LAB — CBC
HEMATOCRIT: 29.1 % — AB (ref 36.0–46.0)
HEMOGLOBIN: 9.4 g/dL — AB (ref 12.0–15.0)
MCH: 30.2 pg (ref 26.0–34.0)
MCHC: 32.3 g/dL (ref 30.0–36.0)
MCV: 93.6 fL (ref 78.0–100.0)
Platelets: 112 10*3/uL — ABNORMAL LOW (ref 150–400)
RBC: 3.11 MIL/uL — ABNORMAL LOW (ref 3.87–5.11)
RDW: 14.8 % (ref 11.5–15.5)
WBC: 6.5 10*3/uL (ref 4.0–10.5)

## 2017-06-18 LAB — VANCOMYCIN, TROUGH: VANCOMYCIN TR: 10 ug/mL — AB (ref 15–20)

## 2017-06-18 LAB — CREATININE, SERUM
Creatinine, Ser: 0.73 mg/dL (ref 0.44–1.00)
GFR calc Af Amer: >60 mL/min (ref >60)

## 2017-06-18 LAB — VANCOMYCIN, PEAK: VANCOMYCIN PK: 22 ug/mL — AB (ref 30–40)

## 2017-06-18 MED ORDER — GENTAMICIN IN SALINE 1-0.9 MG/ML-% IV SOLN
100.0000 mg | Freq: Two times a day (BID) | INTRAVENOUS | Status: DC
  Administered 2017-06-19 – 2017-06-23 (×8): 100 mg via INTRAVENOUS
  Filled 2017-06-18 (×9): qty 100

## 2017-06-18 MED ORDER — VANCOMYCIN HCL IN DEXTROSE 1-5 GM/200ML-% IV SOLN
1000.0000 mg | Freq: Two times a day (BID) | INTRAVENOUS | Status: DC
  Administered 2017-06-18 – 2017-06-22 (×9): 1000 mg via INTRAVENOUS
  Filled 2017-06-18 (×12): qty 200

## 2017-06-18 MED ORDER — GADOBENATE DIMEGLUMINE 529 MG/ML IV SOLN
15.0000 mL | Freq: Once | INTRAVENOUS | Status: AC | PRN
  Administered 2017-06-18: 13 mL via INTRAVENOUS

## 2017-06-18 MED ORDER — GENTAMICIN IN SALINE 1-0.9 MG/ML-% IV SOLN
100.0000 mg | Freq: Once | INTRAVENOUS | Status: AC
  Administered 2017-06-18: 100 mg via INTRAVENOUS
  Filled 2017-06-18: qty 100

## 2017-06-18 NOTE — Progress Notes (Signed)
Subjective:  "I am going through withdrawal and having diarrhea and shaking and feel hot"   Antibiotics:  Anti-infectives (From admission, onward)   Start     Dose/Rate Route Frequency Ordered Stop   06/18/17 2200  gentamicin (GARAMYCIN) IVPB 100 mg     100 mg 200 mL/hr over 30 Minutes Intravenous Every 12 hours 06/18/17 1043     06/18/17 2200  vancomycin (VANCOCIN) IVPB 1000 mg/200 mL premix     1,000 mg 200 mL/hr over 60 Minutes Intravenous Every 12 hours 06/18/17 1524     06/18/17 1100  gentamicin (GARAMYCIN) IVPB 100 mg     100 mg 200 mL/hr over 30 Minutes Intravenous  Once 06/18/17 1041 06/18/17 1224   06/17/17 1000  rifampin (RIFADIN) capsule 300 mg     300 mg Oral 3 times daily 06/17/17 0916 07/29/17 0959   06/17/17 1000  gentamicin (GARAMYCIN) 100 mg in dextrose 5 % 50 mL IVPB  Status:  Discontinued     100 mg 105 mL/hr over 30 Minutes Intravenous Every 12 hours 06/17/17 0934 06/18/17 1043   06/16/17 2200  gentamicin (GARAMYCIN) IVPB 100 mg  Status:  Discontinued     100 mg 200 mL/hr over 30 Minutes Intravenous Every 12 hours 06/16/17 1931 06/17/17 0934   06/15/17 1100  fluconazole (DIFLUCAN) tablet 150 mg     150 mg Oral  Once 06/15/17 0952 06/15/17 1010   06/15/17 1000  vancomycin (VANCOCIN) IVPB 750 mg/150 ml premix  Status:  Discontinued     750 mg 150 mL/hr over 60 Minutes Intravenous Every 12 hours 06/15/17 0749 06/18/17 1524   06/13/17 2200  vancomycin (VANCOCIN) IVPB 1000 mg/200 mL premix  Status:  Discontinued     1,000 mg 200 mL/hr over 60 Minutes Intravenous Every 24 hours 06/13/17 0852 06/15/17 0749   06/13/17 0830  piperacillin-tazobactam (ZOSYN) IVPB 3.375 g  Status:  Discontinued     3.375 g 12.5 mL/hr over 240 Minutes Intravenous Every 8 hours 06/13/17 0823 06/13/17 1555   06/13/17 0030  rifampin (RIFADIN) 300 mg in sodium chloride 0.9 % 100 mL IVPB  Status:  Discontinued     300 mg 200 mL/hr over 30 Minutes Intravenous Every 8 hours  06/13/17 0017 06/14/17 1416   06/13/17 0030  piperacillin-tazobactam (ZOSYN) IVPB 3.375 g     3.375 g 100 mL/hr over 30 Minutes Intravenous  Once 06/13/17 0017 06/13/17 0147   06/13/17 0030  vancomycin (VANCOCIN) IVPB 1000 mg/200 mL premix     1,000 mg 200 mL/hr over 60 Minutes Intravenous  Once 06/13/17 0027 06/13/17 0216      Medications: Scheduled Meds: . buPROPion  300 mg Oral Daily  . clotrimazole  1 Applicatorful Vaginal QHS  . divalproex  125 mg Oral QHS  . enoxaparin (LOVENOX) injection  40 mg Subcutaneous Daily  . feeding supplement (ENSURE ENLIVE)  237 mL Oral TID BM  . methadone  60 mg Oral Daily  . multivitamin with minerals  1 tablet Oral Daily  . rifampin  300 mg Oral TID  . risperiDONE  0.25 mg Oral QHS  . sertraline  50 mg Oral Daily  . sodium chloride flush  10-40 mL Intracatheter Q12H   Continuous Infusions: . gentamicin    . vancomycin     PRN Meds:.acetaminophen **OR** acetaminophen, ibuprofen, ondansetron **OR** ondansetron (ZOFRAN) IV, sodium chloride flush    Objective: Weight change:  No intake or output data in the 24 hours ending  06/18/17 1546 Blood pressure 119/88, pulse 85, temperature 98.5 F (36.9 C), temperature source Oral, resp. rate (!) 26, height 5\' 5"  (1.651 m), weight 138 lb 10.7 oz (62.9 kg), SpO2 98 %. Temp:  [98.3 F (36.8 C)-98.5 F (36.9 C)] 98.5 F (36.9 C) (05/31 1307) Pulse Rate:  [72-85] 85 (05/31 1307) Resp:  [16-26] 26 (05/31 1307) BP: (104-119)/(72-88) 119/88 (05/31 1307) SpO2:  [94 %-98 %] 98 % (05/31 1307)  Physical Exam: General: Alert and awake, oriented x3, uncomfortable appearing and tremulous CV: rrr, no mgr Pulm: clear  GI: soft ND Right thigh with induration around her injection site Neuro: nonfocal  CBC:    BMET Recent Labs    06/16/17 0436 06/17/17 0437 06/18/17 0425  NA 137  --   --   K 4.6  --   --   CL 104  --   --   CO2 24  --   --   GLUCOSE 74  --   --   BUN 9  --   --   CREATININE  0.72 0.79 0.73  CALCIUM 8.3*  --   --      Liver Panel  Recent Labs    06/16/17 0436  PROT 6.6  ALBUMIN 2.4*  AST 20  ALT 14  ALKPHOS 85  BILITOT 0.7       Sedimentation Rate No results for input(s): ESRSEDRATE in the last 72 hours. C-Reactive Protein No results for input(s): CRP in the last 72 hours.  Micro Results: Recent Results (from the past 720 hour(s))  Blood Culture (routine x 2)     Status: Abnormal   Collection Time: 06/12/17 11:36 PM  Result Value Ref Range Status   Specimen Description   Final    BLOOD FEMORAL ARTERY LEFT Performed at [facility], [address] [place], [place] [address]    Special Requests   Final    BOTTLES DRAWN AEROBIC AND ANAEROBIC Blood Culture adequate volume Performed at [facility], [address] [place], [place] [address]    Culture  Setup Time   Final    GRAM POSITIVE COCCI IN BOTH AEROBIC AND ANAEROBIC BOTTLES CRITICAL RESULT CALLED TO, READ BACK BY AND VERIFIED WITH: Anette Riedel GREEN [id number] MLM Performed at [facility], [address], [place], [place] [address]    Culture METHICILLIN RESISTANT STAPHYLOCOCCUS AUREUS (A)  Final   Report Status 06/15/2017 FINAL  Final   Organism ID, Bacteria METHICILLIN RESISTANT STAPHYLOCOCCUS AUREUS  Final      Susceptibility   Methicillin resistant staphylococcus aureus - MIC*    CIPROFLOXACIN >=8 RESISTANT Resistant     ERYTHROMYCIN >=8 RESISTANT Resistant     GENTAMICIN <=0.5 SENSITIVE Sensitive     OXACILLIN >=4 RESISTANT Resistant     TETRACYCLINE <=1 SENSITIVE Sensitive     VANCOMYCIN 1 SENSITIVE Sensitive     TRIMETH/SULFA 160 RESISTANT Resistant     CLINDAMYCIN >=8 RESISTANT Resistant     RIFAMPIN <=0.5 SENSITIVE Sensitive     Inducible Clindamycin NEGATIVE Sensitive     * METHICILLIN RESISTANT STAPHYLOCOCCUS AUREUS  Blood Culture ID Panel (Reflexed)     Status: Abnormal   Collection Time: 06/12/17 11:36 PM  Result Value Ref Range  Status   Enterococcus species NOT DETECTED NOT DETECTED Final   Listeria monocytogenes NOT DETECTED NOT DETECTED Final   Staphylococcus species DETECTED (A) NOT DETECTED Final    Comment: CRITICAL RESULT CALLED TO, READ BACK  BY AND VERIFIED WITH: PHARMD T GREEN [id number] MLM    Staphylococcus aureus DETECTED (A) NOT DETECTED Final    Comment: Methicillin (oxacillin)-resistant Staphylococcus aureus (MRSA). MRSA is predictably resistant to beta-lactam antibiotics (except ceftaroline). Preferred therapy is vancomycin unless clinically contraindicated. Patient requires contact precautions if  hospitalized. CRITICAL RESULT CALLED TO, READ BACK BY AND VERIFIED WITH: PHARMD T GREEN [id number] MLM    Methicillin resistance DETECTED (A) NOT DETECTED Final    Comment: CRITICAL RESULT CALLED TO, READ BACK BY AND VERIFIED WITH: PHARMD T GREEN [id number] MLM    Streptococcus species NOT DETECTED NOT DETECTED Final   Streptococcus agalactiae NOT DETECTED NOT DETECTED Final   Streptococcus pneumoniae NOT DETECTED NOT DETECTED Final   Streptococcus pyogenes NOT DETECTED NOT DETECTED Final   Acinetobacter baumannii NOT DETECTED NOT DETECTED Final   Enterobacteriaceae species NOT DETECTED NOT DETECTED Final   Enterobacter cloacae complex NOT DETECTED NOT DETECTED Final   Escherichia coli NOT DETECTED NOT DETECTED Final   Klebsiella oxytoca NOT DETECTED NOT DETECTED Final   Klebsiella pneumoniae NOT DETECTED NOT DETECTED Final   Proteus species NOT DETECTED NOT DETECTED Final   Serratia marcescens NOT DETECTED NOT DETECTED Final   Haemophilus influenzae NOT DETECTED NOT DETECTED Final   Neisseria meningitidis NOT DETECTED NOT DETECTED Final   Pseudomonas aeruginosa NOT DETECTED NOT DETECTED Final   Candida albicans NOT DETECTED NOT DETECTED Final   Candida glabrata NOT DETECTED NOT DETECTED Final   Candida krusei NOT DETECTED NOT DETECTED Final   Candida parapsilosis NOT DETECTED NOT DETECTED  Final   Candida tropicalis NOT DETECTED NOT DETECTED Final    Comment: Performed at [facility], [address], [place], [place] [address]  Blood Culture (routine x 2)     Status: Abnormal   Collection Time: 06/13/17 12:30 AM  Result Value Ref Range Status   Specimen Description   Final    BLOOD LEFT ARM UPPER Performed at [facility], [address] [place], [place] [address]    Special Requests   Final    BOTTLES DRAWN AEROBIC AND ANAEROBIC Blood Culture adequate volume Performed at [facility], [address] [place], [place] [address]    Culture  Setup Time   Final    GRAM POSITIVE COCCI IN BOTH AEROBIC AND ANAEROBIC BOTTLES    Culture (A)  Final    STAPHYLOCOCCUS AUREUS SUSCEPTIBILITIES PERFORMED ON PREVIOUS CULTURE WITHIN THE LAST 5 DAYS. Performed at [facility], [address] [place], [place] [address]    Report Status 06/15/2017 FINAL  Final  Culture, blood (Routine X 2) w Reflex to ID Panel     Status: None   Collection Time: 06/13/17  7:10 PM  Result Value Ref Range Status   Specimen Description   Final    BLOOD RIGHT FOREARM Performed at [facility], [address] [place], [place] [address]    Special Requests   Final    BOTTLES DRAWN AEROBIC ONLY Blood Culture results may not be optimal due to an inadequate volume of blood received in culture bottles Performed at [facility], [address], [place], [place] [address]    Culture   Final    NO GROWTH 5 DAYS Performed at [facility], [address] [place], [place] [address]    Report Status 06/18/2017 FINAL  Final  MRSA PCR Screening     Status: None  Collection Time: 06/15/17  8:33 AM  Result Value Ref Range Status   MRSA by PCR NEGATIVE NEGATIVE Final    Comment:        The GeneXpert MRSA Assay (FDA approved for NASAL specimens only), is one component of a comprehensive MRSA colonization surveillance program. It is  not intended to diagnose MRSA infection nor to guide or monitor treatment for MRSA infections. Performed at [facility], [address], [place], [place] [address]   Culture, blood (Routine X 2) w Reflex to ID Panel     Status: None (Preliminary result)   Collection Time: 06/15/17 10:17 AM  Result Value Ref Range Status   Specimen Description   Final    BLOOD LEFT FOREARM Performed at [facility], [address] [place], [place] [address]    Special Requests   Final    BOTTLES DRAWN AEROBIC ONLY Blood Culture results may not be optimal due to an inadequate volume of blood received in culture bottles Performed at [facility], [address], [place], [place] [address]    Culture   Final    NO GROWTH 3 DAYS Performed at [facility], [address] [place], [place] [address]    Report Status PENDING  Incomplete  Culture, blood (Routine X 2) w Reflex to ID Panel     Status: None (Preliminary result)   Collection Time: 06/15/17 10:17 AM  Result Value Ref Range Status   Specimen Description   Final    BLOOD LEFT HAND Performed at [facility], [address] [place], [place] [address]    Special Requests   Final    BOTTLES DRAWN AEROBIC ONLY Blood Culture adequate volume Performed at [facility], [address] [place], [place] [address]    Culture   Final    NO GROWTH 3 DAYS Performed at [facility], [address] [place], [place] [address]    Report Status PENDING  Incomplete    Studies/Results: [place] Ekg Site Rite  Result Date: 06/18/2017 If Site Rite image not attached, placement could not be confirmed due to current cardiac rhythm.     Assessment/Plan:  INTERVAL HISTORY:  Going through withdrawal  Principal Problem:   Acute bacterial endocarditis Active Problems:   Wound infection   Abscess of right leg   H/O bacterial endocarditis   Heroin  abuse (HCC)   Hepatitis C   Bipolar 2 disorder (HCC)   Hypokalemia   MRSA bacteremia   Cellulitis of right lower extremity   Encounter for central line placement   IVDU (intravenous drug user)    Gloria Meyer is a 42 y.o. 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  #2       [facility] Staphylococcus aureus bacteremia   Staphylococcus aureus bacteremia (SAB) is associated with a high rate of complications and mortality.  Specific aspects of clinical management are critical to optimizing the outcome of patients with SAB.  Therefore, the [facility] Antimicrobial Management Team [facility]) has initiated an intervention aimed at improving the management of SAB at [facility].  To do so, Infectious Diseases physicians are providing  an evidence-based consult for the management of all patients with SAB.     Yes No Comments  Perform follow-up blood cultures (even if the patient is afebrile) to ensure clearance of bacteremia   REPEAT  POST CVL REMOVAL NO GROWTH x3 days  Remove vascular catheter and obtain follow-up blood cultures after the removal of the catheter   Central line is out. I am ok to place new ONE but if her cultures POP positive it will have to come out. AND she cannot leave  the hospital with it  Perform echocardiography to evaluate for endocarditis (transthoracic ECHO is 40-50% sensitive, TEE is > 90% sensitive)   Please keep in mind, that neither test can definitively EXCLUDE endocarditis, and that should clinical suspicion remain high for endocarditis the patient should then still be treated with an "endocarditis" duration of therapy = 6 weeks  TEE + for complex vegetation   Consult electrophysiologist to evaluate implanted cardiac device (pacemaker, ICD)     Ensure source control   Have all abscesses been drained effectively? Have deep seeded infections (septic joints or osteomyelitis) had appropriate surgical debridement?  Not clear will have to observe thigh clinically  Investigate for "metastatic" sites of infection   Will order MRI left hip for   Change antibiotic therapy to vancomycin   Beta-lactam antibiotics are preferred for MSSA due to higher cure rates.   If on Vancomycin, goal trough should be 15 - 20 mcg/mL  Estimated duration of IV antibiotic therapy:  Vancomycin and RIFAMPIN x 6 weeks with Gentamicin IV x 2 weeks    Consult case management for probably prolonged outpatient IV antibiotic therapy   #3  IVDU:  Monitor for IVDU in house as able  Needs to get back on treatment. She seemed very lucid and sober today re her relapse  She needs to get into treatment. She needs to NOT be with boyfriend who was using IVDU as well and learn to stop self medicating with heroine  #3 Hep C: would be happy to treat her as outpatient  Dr. Drue Second is available this weekend for questions.    LOS: 5 days   Acey Lav 06/18/2017, 3:46 PM

## 2017-06-18 NOTE — Progress Notes (Signed)
Patient had a female friend come into room with some prescriptions drugs in hand. Patient's friend stated he had a abscess in his finger and those were what they were used for. Art therapist notified patient's friend that the policy is that no prescription drugs can be brought into the hospital. Patient and friend were agreeable. Will continue to monitor patient.

## 2017-06-18 NOTE — Progress Notes (Signed)
Pharmacy Antibiotic Note  Gloria Meyer is a 42 y.o. female admitted on 06/12/2017 with MRSA bacteremia and thigh abscess.  Patient has a hx of MRSA endocarditis, now acute prosthetic valve endocarditis .  Pharmacy has been consulted for vancomycin and Gentamicin with confirmation of endocarditis of prosthetic valve.   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  Renal: SCr WNL  WBC WNL  Afebrile  Obtained vanc peak and trough at steady state today   Plan:  increase vancomycin to 1000mg  IV q12h (AUC 518)  Continue Gentamicin 100mg  IV q12h (plan for 14 days)  Goal levels (Peak 3-4 mcg/mL and trough < 1 mcg/ml)  Will obtain levels once PICC placed  Monitor SCr daily  Check steady state vancomycin peak and trough  Rifampin recommended with prosthetic valve endocarditis, but note rifampin will reduced methadone levels and may precipitate withdrawal (and may need to reduce methadone dose when rifampin stopped)  Height: 5\' 5"  (165.1 cm) Weight: 138 lb 10.7 oz (62.9 kg) IBW/kg (Calculated) : 57  Temp (24hrs), Avg:98.4 F (36.9 C), Min:98.3 F (36.8 C), Max:98.5 F (36.9 C)  Recent Labs  Lab 06/12/17 2349 06/13/17 1640 06/14/17 0354 06/14/17 1017 06/14/17 1344 06/15/17 1017 06/16/17 0436 06/17/17 0437 06/18/17 0425 06/18/17 1150 06/18/17 1419  WBC  --  4.7 4.6  --   --  4.1 4.0  --  6.5  --   --   CREATININE  --  0.86 0.75  --   --  0.80 0.72 0.79 0.73  --   --   LATICACIDVEN 1.69  --   --  1.8 1.6  --   --   --   --   --   --   VANCOTROUGH  --   --   --   --   --   --   --   --   --  10*  --   VANCOPEAK  --   --   --   --   --   --   --   --   --   --  22*    Estimated Creatinine Clearance: 84.1 mL/min (by C-G formula based on SCr of 0.73 mg/dL).    Allergies  Allergen Reactions  . Sulfa Antibiotics Rash    Antimicrobials this admission:  5/26 Vanc >> 5/26 Rifampin >> 5/27 5/26 Zosyn >> 5/26 5/29 gent >>  Dose  adjustments this admission:  5/28 Vancomycin 1 g IV q24h --> vancomycin 750 mg IV q12h 5/31 vancomycin 1000mg  IV q12h  Microbiology results:  5/25 BCx: MRSA 5/26 BCx: Ngtd  Thank you for allowing pharmacy to be a part of this patient's care.  Arley PhenixEllen Zidan Helget RPh 06/18/2017, 3:24 PM Pager [phone]

## 2017-06-18 NOTE — Progress Notes (Signed)
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   She states that she relapsed about 3 months ago and has been injecting heroin into her right thigh. About 2 weeks ago, she noticed 2 sites of abscess. She lysed it openwith a Narcan needle at home and drained white pus that was mixed with brown. Then about 5 days ago, she started to feel ill with general malaise, fatigue, fever, chills, sweats. She also admits to productive cough of yellow sputum. She denies any chest pain. Has some nausea and has not been able to keep down any food for the past 5 days.    Subjective: No complaints for me today.    Assessment & Plan:   Principal Problem:   Acute bacterial endocarditis/  MRSA bacteremia/  Abscess of right leg/ Heroin abuse - TEE - shows infected Tricuspid bioprosthesis with a large vegetation - cont anibiotics per ID  - left thigh cellulitis has improved- no abscess noted on CT - PICC line ordered as lab draws are getting difficult   Active Problems:  IV Heorine abuse - cont Methadone       Hepatitis C - has not received treatment for this  Bipolar disorder  -Continue with Bupropion 300 mg p.o. daily as well as 50 mg of Quetiapine, Divalproex 125 mg po qHS and Sertraline 50 mg po Daily -Quetiapine being changed back to Risperdal due to interaction with Rifampin- she was taking Risperdal at home and prefers this.   Acute thrombocytopenia - due to sepsis- follow  Candida vaginitis - Fluconazole x 1 and then Clotrimazole ordered by Dr  Marland Mcalpine  Chronic grade 2 dCHF - stable    DVT prophylaxis: Lovenox Code Status: full code Family Communication:  Disposition Plan: follow on tele Consultants:   ID  Cardiology (for TEE)  Procedures:   2 d ECHO Study Conclusions  - Left ventricle: The cavity size was normal. Wall thickness was normal. Systolic function was normal. The estimated ejection fraction was in the range of 60% to 65%. Wall motion was normal; there were no regional wall motion abnormalities. Features are consistent with a pseudonormal left ventricular filling pattern, with concomitant abnormal relaxation and increased filling pressure (grade 2 diastolic dysfunction). - Tricuspid valve: A bioprosthesis was present.  Impressions:  - Normal LV systolic function; moderate diastolic dysfunction; s/p TVR.   TEE 5/29 Antimicrobials:  Anti-infectives (From admission, onward)   Start     Dose/Rate Route Frequency Ordered Stop   06/18/17 2200  gentamicin (GARAMYCIN) IVPB 100 mg     100 mg 200 mL/hr over 30 Minutes Intravenous Every 12 hours 06/18/17 1043     06/18/17 1100  gentamicin (GARAMYCIN) IVPB 100 mg     100 mg 200 mL/hr over 30 Minutes Intravenous  Once 06/18/17 1041 06/18/17 1224   06/17/17 1000  rifampin (RIFADIN) capsule 300 mg     300 mg Oral 3 times daily 06/17/17 0916 07/29/17 0959   06/17/17 1000  gentamicin (GARAMYCIN) 100 mg in dextrose 5 % 50 mL IVPB  Status:  Discontinued     100 mg 105 mL/hr over 30 Minutes Intravenous Every 12  hours 06/17/17 0934 06/18/17 1043   06/16/17 2200  gentamicin (GARAMYCIN) IVPB 100 mg  Status:  Discontinued     100 mg 200 mL/hr over 30 Minutes Intravenous Every 12 hours 06/16/17 1931 06/17/17 0934   06/15/17 1100  fluconazole (DIFLUCAN) tablet 150 mg     150 mg Oral  Once 06/15/17 0952 06/15/17 1010   06/15/17 1000  vancomycin (VANCOCIN) IVPB 750 mg/150 ml premix     750 mg 150 mL/hr over 60 Minutes Intravenous Every 12 hours  06/15/17 0749     06/13/17 2200  vancomycin (VANCOCIN) IVPB 1000 mg/200 mL premix  Status:  Discontinued     1,000 mg 200 mL/hr over 60 Minutes Intravenous Every 24 hours 06/13/17 0852 06/15/17 0749   06/13/17 0830  piperacillin-tazobactam (ZOSYN) IVPB 3.375 g  Status:  Discontinued     3.375 g 12.5 mL/hr over 240 Minutes Intravenous Every 8 hours 06/13/17 0823 06/13/17 1555   06/13/17 0030  rifampin (RIFADIN) 300 mg in sodium chloride 0.9 % 100 mL IVPB  Status:  Discontinued     300 mg 200 mL/hr over 30 Minutes Intravenous Every 8 hours 06/13/17 0017 06/14/17 1416   06/13/17 0030  piperacillin-tazobactam (ZOSYN) IVPB 3.375 g     3.375 g 100 mL/hr over 30 Minutes Intravenous  Once 06/13/17 0017 06/13/17 0147   06/13/17 0030  vancomycin (VANCOCIN) IVPB 1000 mg/200 mL premix     1,000 mg 200 mL/hr over 60 Minutes Intravenous  Once 06/13/17 0027 06/13/17 0216       Objective: Vitals:   06/17/17 1242 06/17/17 2047 06/18/17 0553 06/18/17 1307  BP: 115/71 104/72 104/74 119/88  Pulse: 83 72 73 85  Resp: (!) 24 16 20  (!) 26  Temp: 98 F (36.7 C) 98.3 F (36.8 C) 98.5 F (36.9 C) 98.5 F (36.9 C)  TempSrc: Oral Oral Oral Oral  SpO2: 95% 98% 94% 98%  Weight:      Height:       No intake or output data in the 24 hours ending 06/18/17 1308 Filed Weights   06/12/17 2142 06/13/17 0635 06/16/17 1258  Weight: 61.6 kg (135 lb 11.2 oz) 62.9 kg (138 lb 10.7 oz) 62.9 kg (138 lb 10.7 oz)    Examination: General exam: Appears comfortable  HEENT: PERRLA, oral mucosa moist, no sclera icterus or thrush Respiratory system: Clear to auscultation. Respiratory effort normal. Cardiovascular system: S1 & S2 heard, RRR.   Gastrointestinal system: Abdomen soft, non-tender, nondistended. Normal bowel sound. No organomegaly Central nervous system: Alert and oriented. No focal neurological deficits. Extremities: No cyanosis, clubbing or edema Skin: numerous scabs and bruises all over her  body Psychiatry:  Flat affect   Data Reviewed: I have personally reviewed following labs and imaging studies  CBC: Recent Labs  Lab 06/12/17 2335 06/13/17 1640 06/14/17 0354 06/15/17 1017 06/16/17 0436 06/18/17 0425  WBC 2.8* 4.7 4.6 4.1 4.0 6.5  NEUTROABS 2.4  --   --  2.7 2.9  --   HGB 12.5 11.8* 10.6* 10.1* 11.8* 9.4*  HCT 35.9* 35.6* 31.8* 30.5* 35.9* 29.1*  MCV 90.2 92.7 91.4 92.4 91.8 93.6  PLT 79* 52* 48* 56* 71* 112*   Basic Metabolic Panel: Recent Labs  Lab 06/12/17 2335 06/13/17 1640 06/14/17 0354 06/15/17 1017 06/16/17 0436 06/16/17 0504 06/17/17 0437 06/18/17 0425  NA 130*  --  134* 136 137  --   --   --   K 3.0*  --  3.0* 3.2* 4.6  --   --   --  CL 96*  --  101 104 104  --   --   --   CO2 24  --  26 23 24   --   --   --   GLUCOSE 93  --  132* 118* 74  --   --   --   BUN 19  --  15 9 9   --   --   --   CREATININE 1.21* 0.86 0.75 0.80 0.72  --  0.79 0.73  CALCIUM 8.2*  --  8.2* 8.0* 8.3*  --   --   --   MG  --  2.0  --  1.8 1.8  --   --   --   PHOS  --   --   --  4.2  --  3.8  --   --    GFR: Estimated Creatinine Clearance: 84.1 mL/min (by C-G formula based on SCr of 0.73 mg/dL). Liver Function Tests: Recent Labs  Lab 06/12/17 2335 06/15/17 1017 06/16/17 0436  AST 30 15 20   ALT 20 13* 14  ALKPHOS 106 73 85  BILITOT 1.3* 0.8 0.7  PROT 6.5 5.6* 6.6  ALBUMIN 2.8* 2.1* 2.4*   No results for input(s): LIPASE, AMYLASE in the last 168 hours. No results for input(s): AMMONIA in the last 168 hours. Coagulation Profile: No results for input(s): INR, PROTIME in the last 168 hours. Cardiac Enzymes: Recent Labs  Lab 06/12/17 2335  TROPONINI <0.03   BNP (last 3 results) No results for input(s): PROBNP in the last 8760 hours. HbA1C: No results for input(s): HGBA1C in the last 72 hours. CBG: No results for input(s): GLUCAP in the last 168 hours. Lipid Profile: No results for input(s): CHOL, HDL, LDLCALC, TRIG, CHOLHDL, LDLDIRECT in the last 72  hours. Thyroid Function Tests: No results for input(s): TSH, T4TOTAL, FREET4, T3FREE, THYROIDAB in the last 72 hours. Anemia Panel: No results for input(s): VITAMINB12, FOLATE, FERRITIN, TIBC, IRON, RETICCTPCT in the last 72 hours. Urine analysis:    Component Value Date/Time   COLORURINE YELLOW 06/13/2017 0540   APPEARANCEUR CLOUDY (A) 06/13/2017 0540   LABSPEC <1.005 (L) 06/13/2017 0540   PHURINE 6.5 06/13/2017 0540   GLUCOSEU NEGATIVE 06/13/2017 0540   HGBUR MODERATE (A) 06/13/2017 0540   BILIRUBINUR NEGATIVE 06/13/2017 0540   KETONESUR NEGATIVE 06/13/2017 0540   PROTEINUR NEGATIVE 06/13/2017 0540   NITRITE NEGATIVE 06/13/2017 0540   LEUKOCYTESUR NEGATIVE 06/13/2017 0540   Sepsis Labs: @LABRCNTIP (procalcitonin:4,lacticidven:4) ) Recent Results (from the past 240 hour(s))  Blood Culture (routine x 2)     Status: Abnormal   Collection Time: 06/12/17 11:36 PM  Result Value Ref Range Status   Specimen Description   Final    BLOOD FEMORAL ARTERY LEFT Performed at [facility] [place], [address] [place], [place] [address]    Special Requests   Final    BOTTLES DRAWN AEROBIC AND ANAEROBIC Blood Culture adequate volume Performed at [facility], [address] [place], [place] [address]    Culture  Setup Time   Final    GRAM POSITIVE COCCI IN BOTH AEROBIC AND ANAEROBIC BOTTLES CRITICAL RESULT CALLED TO, READ BACK BY AND VERIFIED WITH: Anette RiedelHARMD T GREEN [phone] [id number] MLM Performed at [facility], [address] [place], [place] [address]    Culture METHICILLIN RESISTANT STAPHYLOCOCCUS AUREUS (A)  Final   Report Status 06/15/2017 FINAL  Final   Organism ID, Bacteria METHICILLIN RESISTANT STAPHYLOCOCCUS AUREUS  Final  Susceptibility   Methicillin resistant staphylococcus aureus - MIC*    CIPROFLOXACIN >=8 RESISTANT Resistant     ERYTHROMYCIN >=8 RESISTANT Resistant     GENTAMICIN <=0.5 SENSITIVE Sensitive     OXACILLIN >=4 RESISTANT Resistant      TETRACYCLINE <=1 SENSITIVE Sensitive     VANCOMYCIN 1 SENSITIVE Sensitive     TRIMETH/SULFA 160 RESISTANT Resistant     CLINDAMYCIN >=8 RESISTANT Resistant     RIFAMPIN <=0.5 SENSITIVE Sensitive     Inducible Clindamycin NEGATIVE Sensitive     * METHICILLIN RESISTANT STAPHYLOCOCCUS AUREUS  Blood Culture ID Panel (Reflexed)     Status: Abnormal   Collection Time: 06/12/17 11:36 PM  Result Value Ref Range Status   Enterococcus species NOT DETECTED NOT DETECTED Final   Listeria monocytogenes NOT DETECTED NOT DETECTED Final   Staphylococcus species DETECTED (A) NOT DETECTED Final    Comment: CRITICAL RESULT CALLED TO, READ BACK BY AND VERIFIED WITH: PHARMD T GREEN [id number] MLM    Staphylococcus aureus DETECTED (A) NOT DETECTED Final    Comment: Methicillin (oxacillin)-resistant Staphylococcus aureus (MRSA). MRSA is predictably resistant to beta-lactam antibiotics (except ceftaroline). Preferred therapy is vancomycin unless clinically contraindicated. Patient requires contact precautions if  hospitalized. CRITICAL RESULT CALLED TO, READ BACK BY AND VERIFIED WITH: PHARMD T GREEN [id number] MLM    Methicillin resistance DETECTED (A) NOT DETECTED Final    Comment: CRITICAL RESULT CALLED TO, READ BACK BY AND VERIFIED WITH: PHARMD T GREEN [id number] MLM    Streptococcus species NOT DETECTED NOT DETECTED Final   Streptococcus agalactiae NOT DETECTED NOT DETECTED Final   Streptococcus pneumoniae NOT DETECTED NOT DETECTED Final   Streptococcus pyogenes NOT DETECTED NOT DETECTED Final   Acinetobacter baumannii NOT DETECTED NOT DETECTED Final   Enterobacteriaceae species NOT DETECTED NOT DETECTED Final   Enterobacter cloacae complex NOT DETECTED NOT DETECTED Final   Escherichia coli NOT DETECTED NOT DETECTED Final   Klebsiella oxytoca NOT DETECTED NOT DETECTED Final   Klebsiella pneumoniae NOT DETECTED NOT DETECTED Final   Proteus species NOT DETECTED NOT DETECTED Final   Serratia  marcescens NOT DETECTED NOT DETECTED Final   Haemophilus influenzae NOT DETECTED NOT DETECTED Final   Neisseria meningitidis NOT DETECTED NOT DETECTED Final   Pseudomonas aeruginosa NOT DETECTED NOT DETECTED Final   Candida albicans NOT DETECTED NOT DETECTED Final   Candida glabrata NOT DETECTED NOT DETECTED Final   Candida krusei NOT DETECTED NOT DETECTED Final   Candida parapsilosis NOT DETECTED NOT DETECTED Final   Candida tropicalis NOT DETECTED NOT DETECTED Final    Comment: Performed at [facility], [address] [place], [place] [address]  Blood Culture (routine x 2)     Status: Abnormal   Collection Time: 06/13/17 12:30 AM  Result Value Ref Range Status   Specimen Description   Final    BLOOD LEFT ARM UPPER Performed at [facility], [address] [place], [place] [address]    Special Requests   Final    BOTTLES DRAWN AEROBIC AND ANAEROBIC Blood Culture adequate volume Performed at [facility], [address] [place], [place] [address]    Culture  Setup Time   Final    GRAM POSITIVE COCCI IN BOTH AEROBIC AND ANAEROBIC BOTTLES    Culture (A)  Final    STAPHYLOCOCCUS AUREUS SUSCEPTIBILITIES PERFORMED ON PREVIOUS CULTURE WITHIN THE LAST 5 DAYS. Performed at [facility], [address], [place],  [place] [address]    Report Status 06/15/2017 FINAL  Final  Culture, blood (Routine X 2) w Reflex to ID Panel     Status: None   Collection Time: 06/13/17  7:10 PM  Result Value Ref Range Status   Specimen Description   Final    BLOOD RIGHT FOREARM Performed at [facility], [address] [place], [place] [address]    Special Requests   Final    BOTTLES DRAWN AEROBIC ONLY Blood Culture results may not be optimal due to an inadequate volume of blood received in culture bottles Performed at [facility], [address], [place], [place] [address]    Culture   Final    NO GROWTH 5 DAYS Performed at [facility], [address], [place], [place] [address]    Report Status 06/18/2017 FINAL  Final  MRSA PCR Screening     Status: None   Collection Time: 06/15/17  8:33 AM  Result Value Ref Range Status   MRSA by PCR NEGATIVE NEGATIVE Final    Comment:        The GeneXpert MRSA Assay (FDA approved for NASAL specimens only), is one component of a comprehensive MRSA colonization surveillance program. It is not intended to diagnose MRSA infection nor to guide or monitor treatment for MRSA infections. Performed at [facility], [address] [place], [place] [address]   Culture, blood (Routine X 2) w Reflex to ID Panel     Status: None (Preliminary result)   Collection Time: 06/15/17 10:17 AM  Result Value Ref Range Status   Specimen Description   Final    BLOOD LEFT FOREARM Performed at [facility], [address] [place], [place] [address]    Special Requests   Final    BOTTLES DRAWN AEROBIC ONLY Blood Culture results may not be optimal due to an inadequate volume of blood received in culture bottles Performed at [facility], [address] [place], [place] [address]    Culture   Final    NO GROWTH 3 DAYS Performed at [facility], [address], [place], [place] [address]    Report Status PENDING  Incomplete  Culture, blood (Routine X 2) w Reflex to ID Panel     Status: None (Preliminary result)   Collection Time: 06/15/17 10:17 AM  Result Value Ref Range Status   Specimen Description   Final    BLOOD LEFT HAND Performed at [facility], [address], [place], [place] [address]    Special Requests   Final    BOTTLES DRAWN AEROBIC ONLY Blood Culture adequate volume Performed at [facility], [address] [place], [place] [address]    Culture   Final    NO GROWTH 3 DAYS Performed at [facility], [address] [place], [place] [address]    Report Status  PENDING  Incomplete         Radiology Studies: [place] Ekg Site Rite  Result Date: 06/18/2017 If Site Rite image not attached, placement could not be confirmed due to current cardiac rhythm.     Scheduled Meds: . buPROPion  300 mg Oral Daily  . clotrimazole  1 Applicatorful Vaginal QHS  . divalproex  125 mg Oral QHS  . enoxaparin (LOVENOX) injection  40 mg Subcutaneous Daily  . feeding supplement (ENSURE ENLIVE)  237 mL Oral TID BM  . methadone  60  mg Oral Daily  . multivitamin with minerals  1 tablet Oral Daily  . rifampin  300 mg Oral TID  . risperiDONE  0.25 mg Oral QHS  . sertraline  50 mg Oral Daily  . sodium chloride flush  10-40 mL Intracatheter Q12H   Continuous Infusions: . gentamicin    . vancomycin Stopped (06/18/17 1225)     LOS: 5 days    Time spent in minutes: 35    Calvert Cantor, MD [facility] Pager: [email or web address] Password [email or web address] 06/18/2017, 1:08 PM

## 2017-06-19 DIAGNOSIS — M25552 Pain in left hip: Secondary | ICD-10-CM | POA: Diagnosis present

## 2017-06-19 LAB — CREATININE, SERUM
Creatinine, Ser: 0.74 mg/dL (ref 0.44–1.00)
GFR calc non Af Amer: >60 mL/min (ref >60)

## 2017-06-19 LAB — GENTAMICIN LEVEL, TROUGH: Gentamicin Trough: 1.2 ug/mL (ref 0.5–2.0)

## 2017-06-19 MED ORDER — SODIUM CHLORIDE 0.9% FLUSH
10.0000 mL | INTRAVENOUS | Status: DC | PRN
  Administered 2017-06-22 – 2017-06-23 (×3): 10 mL
  Filled 2017-06-19 (×3): qty 40

## 2017-06-19 MED ORDER — SODIUM CHLORIDE 0.9% FLUSH
10.0000 mL | Freq: Two times a day (BID) | INTRAVENOUS | Status: DC
  Administered 2017-06-19 – 2017-06-24 (×4): 10 mL

## 2017-06-19 NOTE — Progress Notes (Signed)
Peripherally Inserted Central Catheter/Midline Placement  The IV Nurse has discussed with the patient and/or persons authorized to consent for the patient, the purpose of this procedure and the potential benefits and risks involved with this procedure.  The benefits include less needle sticks, lab draws from the catheter, and the patient may be discharged home with the catheter. Risks include, but not limited to, infection, bleeding, blood clot (thrombus formation), and puncture of an artery; nerve damage and irregular heartbeat and possibility to perform a PICC exchange if needed/ordered by physician.  Alternatives to this procedure were also discussed.  Bard Power PICC patient education guide, fact sheet on infection prevention and patient information card has been provided to patient /or left at bedside.    PICC/Midline Placement Documentation  PICC Single Lumen 06/19/17 PICC Right Basilic 37 cm 0 cm (Active)  Indication for Insertion or Continuance of Line Prolonged intravenous therapies;Limited venous access - need for IV therapy >5 days (PICC only);Poor Vasculature-patient has had multiple peripheral attempts or PIVs lasting less than 24 hours 06/19/2017 11:49 AM  Exposed Catheter (cm) 0 cm 06/19/2017 11:49 AM  Site Assessment Clean;Dry;Intact 06/19/2017 11:49 AM  Line Status Flushed;Saline locked;Blood return noted 06/19/2017 11:49 AM  Dressing Type Transparent 06/19/2017 11:49 AM  Dressing Status Clean;Intact;Dry;Antimicrobial disc in place 06/19/2017 11:49 AM  Line Care Connections checked and tightened 06/19/2017 11:49 AM  Line Adjustment (NICU/IV Team Only) No 06/19/2017 11:49 AM  Dressing Intervention New dressing 06/19/2017 11:49 AM  Dressing Change Due 06/26/17 06/19/2017 11:49 AM       Elliot Dallyiggs, Donnette Macmullen Wright 06/19/2017, 11:50 AM

## 2017-06-19 NOTE — Progress Notes (Signed)
Pharmacy Antibiotic Note  Gloria CostaSabrina Meyer is a 42 y.o. female admitted on 06/12/2017 with MRSA bacteremia and thigh abscess.  Patient has a hx of MRSA endocarditis, now acute prosthetic valve endocarditis .  Pharmacy has been consulted for vancomycin and Gentamicin with confirmation of endocarditis of prosthetic valve.   Today, 06/19/2017  TEE reveals vegetatation of her bioprosthetic tricuspid valve  Renal: SCr WNL  WBC WNL  Afebrile  Obtained gent trough today of 1.2, ~ 9 hour level, true trough probably ~ 0.6-0.8   Plan:  Continue gentamcin 100mg  IV q12h  Goal levels (Peak 3-4 mcg/mL and trough < 1 mcg/ml)  continue vancomycin to 1000mg  IV q12h (AUC 518)  Monitor SCr daily  Rifampin recommended with prosthetic valve endocarditis, but note rifampin will reduced methadone levels and may precipitate withdrawal (and may need to reduce methadone dose when rifampin stopped)  Height: 5\' 5"  (165.1 cm) Weight: 138 lb 10.7 oz (62.9 kg) IBW/kg (Calculated) : 57  Temp (24hrs), Avg:99 F (37.2 C), Min:98.5 F (36.9 C), Max:99.6 F (37.6 C)  Recent Labs  Lab 06/12/17 2349 06/13/17 1640 06/14/17 0354 06/14/17 1017 06/14/17 1344 06/15/17 1017 06/16/17 0436 06/17/17 0437 06/18/17 0425 06/18/17 1150 06/18/17 1419 06/19/17 0727 06/19/17 0927  WBC  --  4.7 4.6  --   --  4.1 4.0  --  6.5  --   --   --   --   CREATININE  --  0.86 0.75  --   --  0.80 0.72 0.79 0.73  --   --  0.74  --   LATICACIDVEN 1.69  --   --  1.8 1.6  --   --   --   --   --   --   --   --   VANCOTROUGH  --   --   --   --   --   --   --   --   --  10*  --   --   --   VANCOPEAK  --   --   --   --   --   --   --   --   --   --  22*  --   --   GENTTROUGH  --   --   --   --   --   --   --   --   --   --   --   --  1.2    Estimated Creatinine Clearance: 84.1 mL/min (by C-G formula based on SCr of 0.74 mg/dL).    Allergies  Allergen Reactions  . Sulfa Antibiotics Rash    Antimicrobials this admission:  5/26  Vanc >> 5/26 Rifampin >> 5/27 5/26 Zosyn >> 5/26 5/29 gent >>  Dose adjustments this admission:  5/28 Vancomycin 1 g IV q24h --> vancomycin 750 mg IV q12h 5/31 vancomycin 1000mg  IV q12h  Microbiology results:  5/25 BCx: MRSA 5/26 BCx: Ngtd  Thank you for allowing pharmacy to be a part of this patient's care.  Arley PhenixEllen Xiong Haidar RPh 06/19/2017, 12:22 PM Pager [phone]

## 2017-06-19 NOTE — Progress Notes (Signed)
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   She states that she relapsed about 3 months ago and has been injecting heroin into her right thigh. About 2 weeks ago, she noticed 2 sites of abscess. She lysed it openwith a Narcan needle at home and drained white pus that was mixed with brown. Then about 5 days ago, she started to feel ill with general malaise, fatigue, fever, chills, sweats. She also admits to productive cough of yellow sputum. She denies any chest pain. Has some nausea and has not been able to keep down any food for the past 5 days.    Subjective: Feels hot. Asked for methadone to be changed to 5 AM. No nausea, vomiting or diarrhea. No abdominal pain   Assessment & Plan:   Principal Problem:   Acute bacterial endocarditis/  MRSA bacteremia/  Abscess of right leg/ Heroin abuse - TEE - shows infected Tricuspid bioprosthesis with a large vegetation - cont anibiotics per ID  - left thigh cellulitis has improved- no abscess noted on CT - PICC line has been placed due to difficult blood draws  Active Problems:  IV Heorine abuse - cont Methadone at current dose- no signs of withdrawal- can change to 5 AM   Hepatitis C - has not received treatment for this  Bipolar disorder  -Continue with Bupropion 300 mg p.o. daily as well as 50 mg of Quetiapine, Divalproex 125 mg po qHS and Sertraline 50 mg po Daily -Quetiapine being changed back to Risperdal due to interaction with Rifampin- she was taking Risperdal at home and prefers  this-    Acute thrombocytopenia - due to sepsis- follow  Candida vaginitis - Fluconazole x 1 and then Clotrimazole ordered by Dr Marland Mcalpine- resolved  Chronic grade 2 dCHF - stable    DVT prophylaxis: Lovenox Code Status: full code Family Communication:  Disposition Plan: follow on tele Consultants:   ID  Cardiology (for TEE)  Procedures:   2 d ECHO Study Conclusions  - Left ventricle: The cavity size was normal. Wall thickness was normal. Systolic function was normal. The estimated ejection fraction was in the range of 60% to 65%. Wall motion was normal; there were no regional wall motion abnormalities. Features are consistent with a pseudonormal left ventricular filling pattern, with concomitant abnormal relaxation and increased filling pressure (grade 2 diastolic dysfunction). - Tricuspid valve: A bioprosthesis was present.  Impressions:  - Normal LV systolic function; moderate diastolic dysfunction; s/p TVR.   TEE 5/29 Antimicrobials:  Anti-infectives (From admission, onward)   Start     Dose/Rate Route Frequency Ordered Stop   06/18/17 2200  gentamicin (GARAMYCIN) IVPB 100 mg     100 mg 200 mL/hr over 30 Minutes Intravenous Every 12 hours 06/18/17 1043     06/18/17 2200  vancomycin (VANCOCIN) IVPB 1000 mg/200 mL premix     1,000 mg 200 mL/hr over 60 Minutes Intravenous Every 12 hours 06/18/17 1524     06/18/17 1100  gentamicin (GARAMYCIN) IVPB 100 mg     100 mg 200 mL/hr over 30 Minutes Intravenous  Once 06/18/17 1041 06/18/17 1224   06/17/17 1000  rifampin (RIFADIN) capsule 300 mg     300 mg Oral 3 times daily 06/17/17 0916 07/29/17 0959   06/17/17 1000  gentamicin (GARAMYCIN) 100 mg in dextrose 5 % 50 mL IVPB  Status:  Discontinued     100 mg 105 mL/hr over 30 Minutes Intravenous Every 12 hours 06/17/17 0934 06/18/17 1043   06/16/17 2200  gentamicin (GARAMYCIN) IVPB 100 mg  Status:  Discontinued     100 mg 200 mL/hr over 30 Minutes  Intravenous Every 12 hours 06/16/17 1931 06/17/17 0934   06/15/17 1100  fluconazole (DIFLUCAN) tablet 150 mg     150 mg Oral  Once 06/15/17 0952 06/15/17 1010   06/15/17 1000  vancomycin (VANCOCIN) IVPB 750 mg/150 ml premix  Status:  Discontinued     750 mg 150 mL/hr over 60 Minutes Intravenous Every 12 hours 06/15/17 0749 06/18/17 1524   06/13/17 2200  vancomycin (VANCOCIN) IVPB 1000 mg/200 mL premix  Status:  Discontinued     1,000 mg 200 mL/hr over 60 Minutes Intravenous Every 24 hours 06/13/17 0852 06/15/17 0749   06/13/17 0830  piperacillin-tazobactam (ZOSYN) IVPB 3.375 g  Status:  Discontinued     3.375 g 12.5 mL/hr over 240 Minutes Intravenous Every 8 hours 06/13/17 0823 06/13/17 1555   06/13/17 0030  rifampin (RIFADIN) 300 mg in sodium chloride 0.9 % 100 mL IVPB  Status:  Discontinued     300 mg 200 mL/hr over 30 Minutes Intravenous Every 8 hours 06/13/17 0017 06/14/17 1416   06/13/17 0030  piperacillin-tazobactam (ZOSYN) IVPB 3.375 g     3.375 g 100 mL/hr over 30 Minutes Intravenous  Once 06/13/17 0017 06/13/17 0147   06/13/17 0030  vancomycin (VANCOCIN) IVPB 1000 mg/200 mL premix     1,000 mg 200 mL/hr over 60 Minutes Intravenous  Once 06/13/17 0027 06/13/17 0216       Objective: Vitals:   06/18/17 1307 06/18/17 2047 06/19/17 0441 06/19/17 0947  BP: 119/88 112/77 (!) 93/56 121/78  Pulse: 85 75 75 90  Resp: (!) 26 16 16 18   Temp: 98.5 F (36.9 C) 99 F (37.2 C) 98.8 F (37.1 C) 99.6 F (37.6 C)  TempSrc: Oral Oral Oral Oral  SpO2: 98% 100% 100% 97%  Weight:      Height:        Intake/Output Summary (Last 24 hours) at 06/19/2017 1248 Last data filed at 06/19/2017 0200 Gross per 24 hour  Intake 420 ml  Output -  Net 420 ml   Filed Weights   06/12/17 2142 06/13/17 0635 06/16/17 1258  Weight: 61.6 kg (135 lb 11.2 oz) 62.9 kg (138 lb 10.7 oz) 62.9 kg (138 lb 10.7 oz)    Examination: General exam: Appears comfortable  HEENT: PERRLA, oral mucosa moist, no sclera  icterus or thrush Respiratory system: Clear to auscultation. Respiratory effort normal. Cardiovascular system: S1 & S2 heard, RRR.   Gastrointestinal system: Abdomen soft, non-tender, nondistended. Normal bowel sound. No organomegaly Central nervous system: Alert and oriented. No focal neurological deficits. Extremities: No cyanosis, clubbing or edema Skin: numerous scabs and bruises all over her body Psychiatry:  Flat affect - no change   Data Reviewed: I have personally reviewed following labs and imaging studies  CBC: Recent Labs  Lab 06/12/17 2335 06/13/17 1640 06/14/17 0354 06/15/17 1017 06/16/17 0436 06/18/17 0425  WBC 2.8* 4.7 4.6 4.1 4.0 6.5  NEUTROABS 2.4  --   --  2.7 2.9  --   HGB 12.5 11.8* 10.6* 10.1* 11.8*  9.4*  HCT 35.9* 35.6* 31.8* 30.5* 35.9* 29.1*  MCV 90.2 92.7 91.4 92.4 91.8 93.6  PLT 79* 52* 48* 56* 71* 112*   Basic Metabolic Panel: Recent Labs  Lab 06/12/17 2335 06/13/17 1640 06/14/17 0354 06/15/17 1017 06/16/17 0436 06/16/17 0504 06/17/17 0437 06/18/17 0425 06/19/17 0727  NA 130*  --  134* 136 137  --   --   --   --   K 3.0*  --  3.0* 3.2* 4.6  --   --   --   --   CL 96*  --  101 104 104  --   --   --   --   CO2 24  --  26 23 24   --   --   --   --   GLUCOSE 93  --  132* 118* 74  --   --   --   --   BUN 19  --  15 9 9   --   --   --   --   CREATININE 1.21* 0.86 0.75 0.80 0.72  --  0.79 0.73 0.74  CALCIUM 8.2*  --  8.2* 8.0* 8.3*  --   --   --   --   MG  --  2.0  --  1.8 1.8  --   --   --   --   PHOS  --   --   --  4.2  --  3.8  --   --   --    GFR: Estimated Creatinine Clearance: 84.1 mL/min (by C-G formula based on SCr of 0.74 mg/dL). Liver Function Tests: Recent Labs  Lab 06/12/17 2335 06/15/17 1017 06/16/17 0436  AST 30 15 20   ALT 20 13* 14  ALKPHOS 106 73 85  BILITOT 1.3* 0.8 0.7  PROT 6.5 5.6* 6.6  ALBUMIN 2.8* 2.1* 2.4*   No results for input(s): LIPASE, AMYLASE in the last 168 hours. No results for input(s): AMMONIA in the  last 168 hours. Coagulation Profile: No results for input(s): INR, PROTIME in the last 168 hours. Cardiac Enzymes: Recent Labs  Lab 06/12/17 2335  TROPONINI <0.03   BNP (last 3 results) No results for input(s): PROBNP in the last 8760 hours. HbA1C: No results for input(s): HGBA1C in the last 72 hours. CBG: No results for input(s): GLUCAP in the last 168 hours. Lipid Profile: No results for input(s): CHOL, HDL, LDLCALC, TRIG, CHOLHDL, LDLDIRECT in the last 72 hours. Thyroid Function Tests: No results for input(s): TSH, T4TOTAL, FREET4, T3FREE, THYROIDAB in the last 72 hours. Anemia Panel: No results for input(s): VITAMINB12, FOLATE, FERRITIN, TIBC, IRON, RETICCTPCT in the last 72 hours. Urine analysis:    Component Value Date/Time   COLORURINE YELLOW 06/13/2017 0540   APPEARANCEUR CLOUDY (A) 06/13/2017 0540   LABSPEC <1.005 (L) 06/13/2017 0540   PHURINE 6.5 06/13/2017 0540   GLUCOSEU NEGATIVE 06/13/2017 0540   HGBUR MODERATE (A) 06/13/2017 0540   BILIRUBINUR NEGATIVE 06/13/2017 0540   KETONESUR NEGATIVE 06/13/2017 0540   PROTEINUR NEGATIVE 06/13/2017 0540   NITRITE NEGATIVE 06/13/2017 0540   LEUKOCYTESUR NEGATIVE 06/13/2017 0540   Sepsis Labs: @LABRCNTIP (procalcitonin:4,lacticidven:4) ) Recent Results (from the past 240 hour(s))  Blood Culture (routine x 2)     Status: Abnormal   Collection Time: 06/12/17 11:36 PM  Result Value Ref Range Status   Specimen Description   Final    BLOOD FEMORAL ARTERY LEFT Performed at [facility], [address] [place], [place] [address]  Special Requests   Final    BOTTLES DRAWN AEROBIC AND ANAEROBIC Blood Culture adequate volume Performed at [facility], [address] [place], [place] [address]    Culture  Setup Time   Final    GRAM POSITIVE COCCI IN BOTH AEROBIC AND ANAEROBIC BOTTLES CRITICAL RESULT CALLED TO, READ BACK BY AND VERIFIED WITH: Anette Riedel GREEN [id number] MLM Performed at [facility], [address] [place], [place] [address]    Culture METHICILLIN RESISTANT STAPHYLOCOCCUS AUREUS (A)  Final   Report Status 06/15/2017 FINAL  Final   Organism ID, Bacteria METHICILLIN RESISTANT STAPHYLOCOCCUS AUREUS  Final      Susceptibility   Methicillin resistant staphylococcus aureus - MIC*    CIPROFLOXACIN >=8 RESISTANT Resistant     ERYTHROMYCIN >=8 RESISTANT Resistant     GENTAMICIN <=0.5 SENSITIVE Sensitive     OXACILLIN >=4 RESISTANT Resistant     TETRACYCLINE <=1 SENSITIVE Sensitive     VANCOMYCIN 1 SENSITIVE Sensitive     TRIMETH/SULFA 160 RESISTANT Resistant     CLINDAMYCIN >=8 RESISTANT Resistant     RIFAMPIN <=0.5 SENSITIVE Sensitive     Inducible Clindamycin NEGATIVE Sensitive     * METHICILLIN RESISTANT STAPHYLOCOCCUS AUREUS  Blood Culture ID Panel (Reflexed)     Status: Abnormal   Collection Time: 06/12/17 11:36 PM  Result Value Ref Range Status   Enterococcus species NOT DETECTED NOT DETECTED Final   Listeria monocytogenes NOT DETECTED NOT DETECTED Final   Staphylococcus species DETECTED (A) NOT DETECTED Final    Comment: CRITICAL RESULT CALLED TO, READ BACK BY AND VERIFIED WITH: PHARMD T GREEN [id number] MLM    Staphylococcus aureus DETECTED (A) NOT DETECTED Final    Comment: Methicillin (oxacillin)-resistant Staphylococcus aureus (MRSA). MRSA is predictably resistant to beta-lactam antibiotics (except ceftaroline). Preferred therapy is vancomycin unless clinically contraindicated. Patient requires contact precautions if  hospitalized. CRITICAL RESULT CALLED TO, READ BACK BY AND VERIFIED WITH: PHARMD T GREEN [id number] MLM    Methicillin resistance DETECTED (A) NOT DETECTED Final    Comment: CRITICAL RESULT CALLED TO, READ BACK BY AND VERIFIED WITH: PHARMD T GREEN [id number] MLM    Streptococcus species NOT DETECTED NOT DETECTED Final   Streptococcus agalactiae NOT DETECTED NOT DETECTED Final   Streptococcus pneumoniae NOT DETECTED NOT DETECTED  Final   Streptococcus pyogenes NOT DETECTED NOT DETECTED Final   Acinetobacter baumannii NOT DETECTED NOT DETECTED Final   Enterobacteriaceae species NOT DETECTED NOT DETECTED Final   Enterobacter cloacae complex NOT DETECTED NOT DETECTED Final   Escherichia coli NOT DETECTED NOT DETECTED Final   Klebsiella oxytoca NOT DETECTED NOT DETECTED Final   Klebsiella pneumoniae NOT DETECTED NOT DETECTED Final   Proteus species NOT DETECTED NOT DETECTED Final   Serratia marcescens NOT DETECTED NOT DETECTED Final   Haemophilus influenzae NOT DETECTED NOT DETECTED Final   Neisseria meningitidis NOT DETECTED NOT DETECTED Final   Pseudomonas aeruginosa NOT DETECTED NOT DETECTED Final   Candida albicans NOT DETECTED NOT DETECTED Final   Candida glabrata NOT DETECTED NOT DETECTED Final   Candida krusei NOT DETECTED NOT DETECTED Final   Candida parapsilosis NOT DETECTED NOT DETECTED Final   Candida tropicalis NOT DETECTED NOT DETECTED Final    Comment: Performed at [facility], [address] [place], [place] [address]  Blood Culture (routine x 2)     Status: Abnormal   Collection Time: 06/13/17 12:30 AM  Result Value Ref Range Status  Specimen Description   Final    BLOOD LEFT ARM UPPER Performed at [facility] [place], [address] [place], [place] [address]    Special Requests   Final    BOTTLES DRAWN AEROBIC AND ANAEROBIC Blood Culture adequate volume Performed at [facility] [place], [address] [place], [place] [address]    Culture  Setup Time   Final    GRAM POSITIVE COCCI IN BOTH AEROBIC AND ANAEROBIC BOTTLES    Culture (A)  Final    STAPHYLOCOCCUS AUREUS SUSCEPTIBILITIES PERFORMED ON PREVIOUS CULTURE WITHIN THE LAST 5 DAYS. Performed at [facility], [address] [place] [address] [place], [place] [address]    Report Status 06/15/2017 FINAL  Final  Culture, blood (Routine X 2) w Reflex to ID Panel     Status: None   Collection Time: 06/13/17  7:10 PM  Result Value  Ref Range Status   Specimen Description   Final    BLOOD RIGHT FOREARM Performed at [facility], [address] [place], [place] [address]    Special Requests   Final    BOTTLES DRAWN AEROBIC ONLY Blood Culture results may not be optimal due to an inadequate volume of blood received in culture bottles Performed at [facility], [address] [place], [place] [address]    Culture   Final    NO GROWTH 5 DAYS Performed at [facility], [address] [place], [place] [address]    Report Status 06/18/2017 FINAL  Final  MRSA PCR Screening     Status: None   Collection Time: 06/15/17  8:33 AM  Result Value Ref Range Status   MRSA by PCR NEGATIVE NEGATIVE Final    Comment:        The GeneXpert MRSA Assay (FDA approved for NASAL specimens only), is one component of a comprehensive MRSA colonization surveillance program. It is not intended to diagnose MRSA infection nor to guide or monitor treatment for MRSA infections. Performed at [facility], [address] [place], [place] [address]   Culture, blood (Routine X 2) w Reflex to ID Panel     Status: None (Preliminary result)   Collection Time: 06/15/17 10:17 AM  Result Value Ref Range Status   Specimen Description   Final    BLOOD LEFT FOREARM Performed at [facility], [address] [place], [place] [address]    Special Requests   Final    BOTTLES DRAWN AEROBIC ONLY Blood Culture results may not be optimal due to an inadequate volume of blood received in culture bottles Performed at [facility], [address] [place], [place] [address]    Culture   Final    NO GROWTH 3 DAYS Performed at [facility], [address] [place], [place] [address]    Report Status PENDING  Incomplete  Culture, blood (Routine X 2) w Reflex to ID Panel     Status: None (Preliminary result)   Collection Time: 06/15/17 10:17 AM  Result Value Ref  Range Status   Specimen Description   Final    BLOOD LEFT HAND Performed at [facility], [address] [place], [place] [address]    Special Requests   Final    BOTTLES DRAWN AEROBIC ONLY Blood Culture adequate volume Performed at [facility], [address] [place], [place] [address]    Culture   Final  NO GROWTH 3 DAYS Performed at [facility], [address] [place], [place] [address]    Report Status PENDING  Incomplete         Radiology Studies: Mr Hip Left W Wo Contrast  Result Date: 06/18/2017 CLINICAL DATA:  IV drug abuser new id objects in the right thigh. Left thigh pain and swelling. EXAM: MRI OF THE LEFT HIP WITHOUT AND WITH CONTRAST TECHNIQUE: Multiplanar, multisequence MR imaging was performed both before and after administration of intravenous contrast. CONTRAST:  13 ml MULTIHANCE GADOBENATE DIMEGLUMINE 529 MG/ML IV SOLN COMPARISON:  CT right femur 06/15/2017. FINDINGS: Bones: Bone marrow signal is normal throughout. There is no evidence of osteomyelitis. No fracture or focal lesion. Articular cartilage and labrum Articular cartilage:  Normal. Labrum:  Normal. Joint or bursal effusion Joint effusion:  None. Bursae: Normal. Muscles and tendons Muscles and tendons: A small volume of fluid is seen along fascial planes bilaterally without appreciable enhancement. The appearance is symmetric from right to left. No intramuscular fluid collection is identified. Musculature is preserved. Other findings Miscellaneous: Extensive subcutaneous edema is present throughout the visualized abdomen, pelvis and upper legs. IMPRESSION: Diffuse subcutaneous edema about the imaged abdomen, throughout the pelvis and both upper legs is likely due to volume overload particularly given lack of subcutaneous enhancement. Small volume of fluid in the fascial compartments of the upper legs bilaterally appears symmetric and also likely due to volume  overload. No evidence of abscess, myositis, septic joint or osteomyelitis. Electronically Signed   By: Drusilla Kanner M.D.   On: 06/18/2017 16:08   [place] Ekg Site Rite  Result Date: 06/18/2017 If Site Rite image not attached, placement could not be confirmed due to current cardiac rhythm.     Scheduled Meds: . buPROPion  300 mg Oral Daily  . clotrimazole  1 Applicatorful Vaginal QHS  . divalproex  125 mg Oral QHS  . enoxaparin (LOVENOX) injection  40 mg Subcutaneous Daily  . feeding supplement (ENSURE ENLIVE)  237 mL Oral TID BM  . methadone  60 mg Oral Daily  . multivitamin with minerals  1 tablet Oral Daily  . rifampin  300 mg Oral TID  . risperiDONE  0.25 mg Oral QHS  . sertraline  50 mg Oral Daily  . sodium chloride flush  10-40 mL Intracatheter Q12H  . sodium chloride flush  10-40 mL Intracatheter Q12H   Continuous Infusions: . gentamicin 100 mg (06/19/17 0950)  . vancomycin 1,000 mg (06/19/17 0950)     LOS: 6 days    Time spent in minutes: 35    Calvert Cantor, MD [facility] Pager: [email or web address] Password [facility] 06/19/2017, 12:48 PM

## 2017-06-19 NOTE — Progress Notes (Signed)
Spoke with Dr Drue SecondSnider ID re PICC placement.  States okay to place PICC line.

## 2017-06-19 NOTE — Progress Notes (Signed)
Spoke with Dr Butler Denmarkizwan that pt reports that IR places her PICCs.  Dr Butler Denmarkizwan requests that VAS Team attempts anyway.

## 2017-06-20 ENCOUNTER — Inpatient Hospital Stay (HOSPITAL_COMMUNITY): Payer: Self-pay

## 2017-06-20 LAB — CULTURE, BLOOD (ROUTINE X 2)
CULTURE: NO GROWTH
CULTURE: NO GROWTH
SPECIAL REQUESTS: ADEQUATE

## 2017-06-20 LAB — CREATININE, SERUM: Creatinine, Ser: 0.87 mg/dL (ref 0.44–1.00)

## 2017-06-20 LAB — LACTIC ACID, PLASMA: Lactic Acid, Venous: 1.9 mmol/L (ref 0.5–1.9)

## 2017-06-20 MED ORDER — METHADONE HCL 10 MG/ML PO CONC
70.0000 mg | Freq: Every day | ORAL | Status: DC
  Administered 2017-06-21 – 2017-06-24 (×4): 70 mg via ORAL
  Filled 2017-06-20 (×5): qty 7

## 2017-06-20 MED ORDER — ZOLPIDEM TARTRATE 5 MG PO TABS
5.0000 mg | ORAL_TABLET | Freq: Once | ORAL | Status: AC
  Administered 2017-06-20: 5 mg via ORAL
  Filled 2017-06-20: qty 1

## 2017-06-20 MED ORDER — DICLOFENAC SODIUM 1 % TD GEL
2.0000 g | Freq: Four times a day (QID) | TRANSDERMAL | Status: DC
  Administered 2017-06-20 – 2017-06-24 (×9): 2 g via TOPICAL
  Filled 2017-06-20: qty 100

## 2017-06-20 NOTE — Progress Notes (Signed)
This RN called into room by patient and found patient in fetal position shaking profusely.  Pt expressing frustration and stated she was freezing and felt the worst she has ever felt. Pt was also more verbally aggressive than she has been the previous 2 nights. Multiple blankets given to patient. Oral temperature checked: 98.2. Arvilla MarketA. Kakrakandy paged regarding change in patient's presentation.  Rapid Response also paged and at bedside to see patient.  Rectal temperature checked: 102.8. Arvilla MarketA. Kakrakandy, MD to bedside to see patient and ordered a lactic acid, blood cultures, and a CT head.  Will continue to monitor pt closely. Mardene CelesteAsaro, Kennady Zimmerle I

## 2017-06-20 NOTE — Progress Notes (Signed)
IV team nurse flushed pt's PICC and pt reported feeling/hearing something in her neck. Paged [facility] to notify and received order for CXR to confirm tip placement. Per CXR tip is in jugular system. IV team aware. Will report to day shift RN. Pt in no distress at this time. Will continue to monitor for changes.

## 2017-06-20 NOTE — Significant Event (Addendum)
Patient started spiking fever again.  Complained of headache.  On exam at bedside patient is not in distress and appears nonfocal.  I ordered repeat blood cultures and a CT head. Patient had a call and [facility] surgery done. I [facility] to initiate the process of transfer.  Addendum - Dr.Becky White infectious disease consultant [facility] the [facility]. Awaiting bed placement.  Gloria Meyer.

## 2017-06-20 NOTE — Progress Notes (Addendum)
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   She states that she relapsed about 3 months ago and has been injecting heroin into her right thigh. About 2 weeks ago, she noticed 2 sites of abscess. She lysed it openwith a Narcan needle at home and drained white pus that was mixed with brown. Then about 5 days ago, she started to feel ill with general malaise, fatigue, fever, chills, sweats. She also admits to productive cough of yellow sputum. She denies any chest pain. Has some nausea and has not been able to keep down any food for the past 5 days.    Subjective: Feels better today with Methadone being given at 5 AM. PICC line was in neck and patient cough feel this. PICC team has attempted to correct it.    Assessment & Plan:   Principal Problem:   Acute bacterial endocarditis/  MRSA bacteremia/  Abscess of right leg/ Heroin abuse - TEE - shows infected Tricuspid bioprosthesis with a large vegetation - cont anibiotics per ID  - left thigh cellulitis has improved- no abscess noted on CT - PICC line has been placed due to difficult blood draws- was in right IJ, corrected and now in SVC  Active Problems:  IV Heorine abuse - cont Methadone at current dose- no signs of withdrawal- change to 5 AM is helping but she still has a runny nose and sneezing- will increase to 70 mg today due to interaction with Rifampin Acute thrombocytopenia - due to sepsis-  improving  Candida vaginitis - Fluconazole x 1 and then Clotrimazole  - resolved    Hepatitis C - has not received treatment for this  Bipolar disorder  -Continue with Bupropion 300 mg p.o. daily as well as 50 mg of Quetiapine, Divalproex 125 mg po qHS and Sertraline 50 mg po Daily -Quetiapine being changed back to Risperdal due to interaction with Rifampin- she was taking Risperdal at home and prefers this-     Chronic grade 2 dCHF - stable    DVT prophylaxis: Lovenox Code Status: full code Family Communication:  Disposition Plan: follow on tele Consultants:   ID  Cardiology (for TEE)  Procedures:   2 d ECHO Study Conclusions  - Left ventricle: The cavity size was normal. Wall thickness was normal. Systolic function was normal. The estimated ejection fraction was in the range of 60% to 65%. Wall motion was normal; there were no regional wall motion abnormalities. Features are consistent with a pseudonormal left ventricular filling pattern, with concomitant abnormal relaxation and increased filling pressure (grade 2 diastolic dysfunction). - Tricuspid valve: A bioprosthesis was present.  Impressions:  - Normal LV systolic function; moderate diastolic dysfunction; s/p TVR.   TEE 5/29 Antimicrobials:  Anti-infectives (From admission, onward)   Start     Dose/Rate Route Frequency Ordered Stop   06/18/17 2200  gentamicin (GARAMYCIN) IVPB 100 mg     100 mg 200 mL/hr over 30 Minutes Intravenous Every 12 hours 06/18/17 1043     06/18/17 2200  vancomycin (VANCOCIN) IVPB 1000 mg/200 mL premix     1,000 mg 200 mL/hr over 60 Minutes Intravenous Every  12 hours 06/18/17 1524     06/18/17 1100  gentamicin (GARAMYCIN) IVPB 100 mg     100 mg 200 mL/hr over 30 Minutes Intravenous  Once 06/18/17 1041 06/18/17 1224   06/17/17 1000  rifampin (RIFADIN) capsule 300 mg     300 mg Oral 3 times daily 06/17/17 0916 07/29/17 0959   06/17/17 1000  gentamicin (GARAMYCIN) 100 mg in dextrose 5 % 50 mL IVPB  Status:  Discontinued     100 mg 105 mL/hr  over 30 Minutes Intravenous Every 12 hours 06/17/17 0934 06/18/17 1043   06/16/17 2200  gentamicin (GARAMYCIN) IVPB 100 mg  Status:  Discontinued     100 mg 200 mL/hr over 30 Minutes Intravenous Every 12 hours 06/16/17 1931 06/17/17 0934   06/15/17 1100  fluconazole (DIFLUCAN) tablet 150 mg     150 mg Oral  Once 06/15/17 0952 06/15/17 1010   06/15/17 1000  vancomycin (VANCOCIN) IVPB 750 mg/150 ml premix  Status:  Discontinued     750 mg 150 mL/hr over 60 Minutes Intravenous Every 12 hours 06/15/17 0749 06/18/17 1524   06/13/17 2200  vancomycin (VANCOCIN) IVPB 1000 mg/200 mL premix  Status:  Discontinued     1,000 mg 200 mL/hr over 60 Minutes Intravenous Every 24 hours 06/13/17 0852 06/15/17 0749   06/13/17 0830  piperacillin-tazobactam (ZOSYN) IVPB 3.375 g  Status:  Discontinued     3.375 g 12.5 mL/hr over 240 Minutes Intravenous Every 8 hours 06/13/17 0823 06/13/17 1555   06/13/17 0030  rifampin (RIFADIN) 300 mg in sodium chloride 0.9 % 100 mL IVPB  Status:  Discontinued     300 mg 200 mL/hr over 30 Minutes Intravenous Every 8 hours 06/13/17 0017 06/14/17 1416   06/13/17 0030  piperacillin-tazobactam (ZOSYN) IVPB 3.375 g     3.375 g 100 mL/hr over 30 Minutes Intravenous  Once 06/13/17 0017 06/13/17 0147   06/13/17 0030  vancomycin (VANCOCIN) IVPB 1000 mg/200 mL premix     1,000 mg 200 mL/hr over 60 Minutes Intravenous  Once 06/13/17 0027 06/13/17 0216       Objective: Vitals:   06/19/17 0947 06/19/17 1325 06/19/17 2040 06/20/17 0526  BP: 121/78 101/72 102/66 110/71  Pulse: 90 69 80 80  Resp: 18 12 16 16   Temp: 99.6 F (37.6 C) 98.5 F (36.9 C) 98.6 F (37 C) 98 F (36.7 C)  TempSrc: Oral Oral Oral Oral  SpO2: 97% 98% 98% 94%  Weight:      Height:        Intake/Output Summary (Last 24 hours) at 06/20/2017 1229 Last data filed at 06/20/2017 0200 Gross per 24 hour  Intake 840 ml  Output -  Net 840 ml   Filed Weights   06/12/17 2142 06/13/17 0635 06/16/17 1258  Weight:  61.6 kg (135 lb 11.2 oz) 62.9 kg (138 lb 10.7 oz) 62.9 kg (138 lb 10.7 oz)    Examination: General exam: Appears comfortable  HEENT: PERRLA, oral mucosa moist, no sclera icterus or thrush Respiratory system: Clear to auscultation. Respiratory effort normal. Cardiovascular system: S1 & S2 heard,  No murmurs  Gastrointestinal system: Abdomen soft, non-tender, nondistended. Normal bowel sound. No organomegaly Central nervous system: Alert and oriented. No focal neurological deficits. Extremities: No cyanosis, clubbing or edema Skin: numerous ulcers and bruises Psychiatry:  Mood & affect appropriate.     Data Reviewed: I have personally reviewed following labs and imaging studies  CBC: Recent Labs  Lab 06/13/17 1640 06/14/17 0354 06/15/17 1017  06/16/17 0436 06/18/17 0425  WBC 4.7 4.6 4.1 4.0 6.5  NEUTROABS  --   --  2.7 2.9  --   HGB 11.8* 10.6* 10.1* 11.8* 9.4*  HCT 35.6* 31.8* 30.5* 35.9* 29.1*  MCV 92.7 91.4 92.4 91.8 93.6  PLT 52* 48* 56* 71* 112*   Basic Metabolic Panel: Recent Labs  Lab 06/13/17 1640 06/14/17 0354 06/15/17 1017 06/16/17 0436 06/16/17 0504 06/17/17 0437 06/18/17 0425 06/19/17 0727 06/20/17 0347  NA  --  134* 136 137  --   --   --   --   --   K  --  3.0* 3.2* 4.6  --   --   --   --   --   CL  --  101 104 104  --   --   --   --   --   CO2  --  26 23 24   --   --   --   --   --   GLUCOSE  --  132* 118* 74  --   --   --   --   --   BUN  --  15 9 9   --   --   --   --   --   CREATININE 0.86 0.75 0.80 0.72  --  0.79 0.73 0.74 0.87  CALCIUM  --  8.2* 8.0* 8.3*  --   --   --   --   --   MG 2.0  --  1.8 1.8  --   --   --   --   --   PHOS  --   --  4.2  --  3.8  --   --   --   --    GFR: Estimated Creatinine Clearance: 77.3 mL/min (by C-G formula based on SCr of 0.87 mg/dL). Liver Function Tests: Recent Labs  Lab 06/15/17 1017 06/16/17 0436  AST 15 20  ALT 13* 14  ALKPHOS 73 85  BILITOT 0.8 0.7  PROT 5.6* 6.6  ALBUMIN 2.1* 2.4*   No results  for input(s): LIPASE, AMYLASE in the last 168 hours. No results for input(s): AMMONIA in the last 168 hours. Coagulation Profile: No results for input(s): INR, PROTIME in the last 168 hours. Cardiac Enzymes: No results for input(s): CKTOTAL, CKMB, CKMBINDEX, TROPONINI in the last 168 hours. BNP (last 3 results) No results for input(s): PROBNP in the last 8760 hours. HbA1C: No results for input(s): HGBA1C in the last 72 hours. CBG: No results for input(s): GLUCAP in the last 168 hours. Lipid Profile: No results for input(s): CHOL, HDL, LDLCALC, TRIG, CHOLHDL, LDLDIRECT in the last 72 hours. Thyroid Function Tests: No results for input(s): TSH, T4TOTAL, FREET4, T3FREE, THYROIDAB in the last 72 hours. Anemia Panel: No results for input(s): VITAMINB12, FOLATE, FERRITIN, TIBC, IRON, RETICCTPCT in the last 72 hours. Urine analysis:    Component Value Date/Time   COLORURINE YELLOW 06/13/2017 0540   APPEARANCEUR CLOUDY (A) 06/13/2017 0540   LABSPEC <1.005 (L) 06/13/2017 0540   PHURINE 6.5 06/13/2017 0540   GLUCOSEU NEGATIVE 06/13/2017 0540   HGBUR MODERATE (A) 06/13/2017 0540   BILIRUBINUR NEGATIVE 06/13/2017 0540   KETONESUR NEGATIVE 06/13/2017 0540   PROTEINUR NEGATIVE 06/13/2017 0540   NITRITE NEGATIVE 06/13/2017 0540   LEUKOCYTESUR NEGATIVE 06/13/2017 0540   Sepsis Labs: @LABRCNTIP (procalcitonin:4,lacticidven:4) ) Recent Results (from the past 240 hour(s))  Blood Culture (routine x 2)     Status: Abnormal   Collection Time: 06/12/17 11:36  PM  Result Value Ref Range Status   Specimen Description   Final    BLOOD FEMORAL ARTERY LEFT Performed at [facility], [address] [place], [place] [address]    Special Requests   Final    BOTTLES DRAWN AEROBIC AND ANAEROBIC Blood Culture adequate volume Performed at [facility], [address] [place], [place] [address]    Culture  Setup Time   Final    GRAM POSITIVE COCCI IN BOTH AEROBIC AND ANAEROBIC  BOTTLES CRITICAL RESULT CALLED TO, READ BACK BY AND VERIFIED WITH: Anette Riedel GREEN [id number] MLM Performed at [facility], [address], [place], [place] [address]    Culture METHICILLIN RESISTANT STAPHYLOCOCCUS AUREUS (A)  Final   Report Status 06/15/2017 FINAL  Final   Organism ID, Bacteria METHICILLIN RESISTANT STAPHYLOCOCCUS AUREUS  Final      Susceptibility   Methicillin resistant staphylococcus aureus - MIC*    CIPROFLOXACIN >=8 RESISTANT Resistant     ERYTHROMYCIN >=8 RESISTANT Resistant     GENTAMICIN <=0.5 SENSITIVE Sensitive     OXACILLIN >=4 RESISTANT Resistant     TETRACYCLINE <=1 SENSITIVE Sensitive     VANCOMYCIN 1 SENSITIVE Sensitive     TRIMETH/SULFA 160 RESISTANT Resistant     CLINDAMYCIN >=8 RESISTANT Resistant     RIFAMPIN <=0.5 SENSITIVE Sensitive     Inducible Clindamycin NEGATIVE Sensitive     * METHICILLIN RESISTANT STAPHYLOCOCCUS AUREUS  Blood Culture ID Panel (Reflexed)     Status: Abnormal   Collection Time: 06/12/17 11:36 PM  Result Value Ref Range Status   Enterococcus species NOT DETECTED NOT DETECTED Final   Listeria monocytogenes NOT DETECTED NOT DETECTED Final   Staphylococcus species DETECTED (A) NOT DETECTED Final    Comment: CRITICAL RESULT CALLED TO, READ BACK BY AND VERIFIED WITH: PHARMD T GREEN [id number] MLM    Staphylococcus aureus DETECTED (A) NOT DETECTED Final    Comment: Methicillin (oxacillin)-resistant Staphylococcus aureus (MRSA). MRSA is predictably resistant to beta-lactam antibiotics (except ceftaroline). Preferred therapy is vancomycin unless clinically contraindicated. Patient requires contact precautions if  hospitalized. CRITICAL RESULT CALLED TO, READ BACK BY AND VERIFIED WITH: PHARMD T GREEN [id number] MLM    Methicillin resistance DETECTED (A) NOT DETECTED Final    Comment: CRITICAL RESULT CALLED TO, READ BACK BY AND VERIFIED WITH: PHARMD T GREEN [id number] MLM    Streptococcus species NOT DETECTED NOT  DETECTED Final   Streptococcus agalactiae NOT DETECTED NOT DETECTED Final   Streptococcus pneumoniae NOT DETECTED NOT DETECTED Final   Streptococcus pyogenes NOT DETECTED NOT DETECTED Final   Acinetobacter baumannii NOT DETECTED NOT DETECTED Final   Enterobacteriaceae species NOT DETECTED NOT DETECTED Final   Enterobacter cloacae complex NOT DETECTED NOT DETECTED Final   Escherichia coli NOT DETECTED NOT DETECTED Final   Klebsiella oxytoca NOT DETECTED NOT DETECTED Final   Klebsiella pneumoniae NOT DETECTED NOT DETECTED Final   Proteus species NOT DETECTED NOT DETECTED Final   Serratia marcescens NOT DETECTED NOT DETECTED Final   Haemophilus influenzae NOT DETECTED NOT DETECTED Final   Neisseria meningitidis NOT DETECTED NOT DETECTED Final   Pseudomonas aeruginosa NOT DETECTED NOT DETECTED Final   Candida albicans NOT DETECTED NOT DETECTED Final   Candida glabrata NOT DETECTED NOT DETECTED Final   Candida krusei NOT DETECTED NOT DETECTED Final   Candida parapsilosis NOT DETECTED NOT DETECTED Final   Candida tropicalis NOT DETECTED NOT DETECTED Final    Comment:  Performed at [facility], [address] [place], [place] [address]  Blood Culture (routine x 2)     Status: Abnormal   Collection Time: 06/13/17 12:30 AM  Result Value Ref Range Status   Specimen Description   Final    BLOOD LEFT ARM UPPER Performed at [facility], [address] [place], [place] [address]    Special Requests   Final    BOTTLES DRAWN AEROBIC AND ANAEROBIC Blood Culture adequate volume Performed at [facility], [address] [place], [place] [address]    Culture  Setup Time   Final    GRAM POSITIVE COCCI IN BOTH AEROBIC AND ANAEROBIC BOTTLES    Culture (A)  Final    STAPHYLOCOCCUS AUREUS SUSCEPTIBILITIES PERFORMED ON PREVIOUS CULTURE WITHIN THE LAST 5 DAYS. Performed at [facility], [address] [place], [place] [address]    Report Status 06/15/2017 FINAL   Final  Culture, blood (Routine X 2) w Reflex to ID Panel     Status: None   Collection Time: 06/13/17  7:10 PM  Result Value Ref Range Status   Specimen Description   Final    BLOOD RIGHT FOREARM Performed at [facility], [address] [place], [place] [address]    Special Requests   Final    BOTTLES DRAWN AEROBIC ONLY Blood Culture results may not be optimal due to an inadequate volume of blood received in culture bottles Performed at [facility], [address] [place], [place] [address]    Culture   Final    NO GROWTH 5 DAYS Performed at [facility], [address] [place], [place] [address]    Report Status 06/18/2017 FINAL  Final  MRSA PCR Screening     Status: None   Collection Time: 06/15/17  8:33 AM  Result Value Ref Range Status   MRSA by PCR NEGATIVE NEGATIVE Final    Comment:        The GeneXpert MRSA Assay (FDA approved for NASAL specimens only), is one component of a comprehensive MRSA colonization surveillance program. It is not intended to diagnose MRSA infection nor to guide or monitor treatment for MRSA infections. Performed at [facility], [address] [place], [place] [address]   Culture, blood (Routine X 2) w Reflex to ID Panel     Status: None   Collection Time: 06/15/17 10:17 AM  Result Value Ref Range Status   Specimen Description   Final    BLOOD LEFT FOREARM Performed at [facility], [address] [place], [place] [address]    Special Requests   Final    BOTTLES DRAWN AEROBIC ONLY Blood Culture results may not be optimal due to an inadequate volume of blood received in culture bottles Performed at [facility], [address] [place], [place] [address]    Culture   Final    NO GROWTH 5 DAYS Performed at [facility], [address] [place], [place] [address]    Report Status 06/20/2017 FINAL  Final  Culture, blood (Routine X 2) w  Reflex to ID Panel     Status: None   Collection Time: 06/15/17 10:17 AM  Result Value Ref Range Status   Specimen Description   Final    BLOOD LEFT HAND Performed at [facility], [address] [place], [place] [address]    Special Requests  Final    BOTTLES DRAWN AEROBIC ONLY Blood Culture adequate volume Performed at [facility], [address], [place], [place] [address]    Culture   Final    NO GROWTH 5 DAYS Performed at [facility], [address] [place], [place] [address]    Report Status 06/20/2017 FINAL  Final         Radiology Studies: Mr Hip Left W Wo Contrast  Result Date: 06/18/2017 CLINICAL DATA:  IV drug abuser new id objects in the right thigh. Left thigh pain and swelling. EXAM: MRI OF THE LEFT HIP WITHOUT AND WITH CONTRAST TECHNIQUE: Multiplanar, multisequence MR imaging was performed both before and after administration of intravenous contrast. CONTRAST:  13 ml MULTIHANCE GADOBENATE DIMEGLUMINE 529 MG/ML IV SOLN COMPARISON:  CT right femur 06/15/2017. FINDINGS: Bones: Bone marrow signal is normal throughout. There is no evidence of osteomyelitis. No fracture or focal lesion. Articular cartilage and labrum Articular cartilage:  Normal. Labrum:  Normal. Joint or bursal effusion Joint effusion:  None. Bursae: Normal. Muscles and tendons Muscles and tendons: A small volume of fluid is seen along fascial planes bilaterally without appreciable enhancement. The appearance is symmetric from right to left. No intramuscular fluid collection is identified. Musculature is preserved. Other findings Miscellaneous: Extensive subcutaneous edema is present throughout the visualized abdomen, pelvis and upper legs. IMPRESSION: Diffuse subcutaneous edema about the imaged abdomen, throughout the pelvis and both upper legs is likely due to volume overload particularly given lack of subcutaneous enhancement. Small volume of fluid in the fascial  compartments of the upper legs bilaterally appears symmetric and also likely due to volume overload. No evidence of abscess, myositis, septic joint or osteomyelitis. Electronically Signed   By: Drusilla Kanner M.D.   On: 06/18/2017 16:08   Dg Chest Port 1 View  Result Date: 06/20/2017 CLINICAL DATA:  PICC line in place, s/p power flush via IV team. Pt feels her PICC line is not located correctly EXAM: PORTABLE CHEST 1 VIEW COMPARISON:  Plain film from earlier same day. FINDINGS: PICC line has been repositioned and is now adequately located with tip at the level of the mid/lower SVC. Lungs are clear. No pleural effusion or pneumothorax seen. Heart size and mediastinal contours are within normal limits. IMPRESSION: PICC line is now adequately positioned with tip at the level of the mid/lower SVC. Electronically Signed   By: Bary Richard M.D.   On: 06/20/2017 10:45   Dg Chest Port 1 View  Result Date: 06/20/2017 CLINICAL DATA:  Right arm PICC placement EXAM: PORTABLE CHEST 1 VIEW COMPARISON:  06/13/2017 FINDINGS: Internal jugular central line is been removed. Right arm PICC is placed, but passes the right neck. Previous median sternotomy and valve replacement. Heart size remains normal. The lungs remain clear. IMPRESSION: Right arm PICC travels up the right neck, presumably in the jugular system. These results will be called to the ordering clinician or representative by the Radiologist Assistant, and communication documented in the PACS or zVision Dashboard. Electronically Signed   By: Paulina Fusi M.D.   On: 06/20/2017 06:51   [place] Ekg Site Rite  Result Date: 06/18/2017 If Site Rite image not attached, placement could not be confirmed due to current cardiac rhythm.     Scheduled Meds: . buPROPion  300 mg Oral Daily  . diclofenac sodium  2 g Topical QID  . divalproex  125 mg Oral QHS  . enoxaparin (LOVENOX) injection  40 mg Subcutaneous Daily  . feeding  supplement (ENSURE ENLIVE)  237 mL Oral TID  BM  . [START ON 06/21/2017] methadone  70 mg Oral Daily  . multivitamin with minerals  1 tablet Oral Daily  . rifampin  300 mg Oral TID  . risperiDONE  0.25 mg Oral QHS  . sertraline  50 mg Oral Daily  . sodium chloride flush  10-40 mL Intracatheter Q12H  . sodium chloride flush  10-40 mL Intracatheter Q12H   Continuous Infusions: . gentamicin Stopped (06/19/17 2332)  . vancomycin Stopped (06/19/17 2240)     LOS: 7 days    Time spent in minutes: 35    Calvert Cantor, MD [facility] Pager: [email or web address] Password [facility] 06/20/2017, 12:29 PM

## 2017-06-20 NOTE — Progress Notes (Signed)
Pt [facility].  Hosie PoissonA. Kakrakady, MD made aware and verbalized he would begin the transfer process.  Mardene CelesteAsaro, Brando Taves I

## 2017-06-20 NOTE — Progress Notes (Signed)
Called to pt's bedside to execute AD. Pt was awakened when I arrived and became alert and talking. Her best friend Casimiro Needle(Michael) was bedside. He is also her HCPOA. Witnesses were secured upon arrival of [facility] to [employer] documents.  Pt was personally handed the original and one copy and a copy was placed in her files. Please page if additional assistance is needed. Chaplain Marjory LiesPamela Tonya Carlile Holder, MDiv.   06/20/17 2200  Clinical Encounter Type  Visited With Patient and family together

## 2017-06-20 NOTE — Progress Notes (Addendum)
Pt with RUA SL PICC- site wnl. Flushes easily with great blood return. Pt c/o hearing or feeling water in right side of neck when flushing PICC. Will notify bedside RN to get order for CXR to verify tip placement of PICC.  Gilman SchmidtMelissa Aubreana Cornacchia, RN IV Team

## 2017-06-20 NOTE — Progress Notes (Signed)
R SL PICC previously in R IJ, after power flush PICC tip repositioned at the level of mid/lower SVC per Radiology report.

## 2017-06-20 NOTE — Progress Notes (Signed)
Pt states she would like Tammy SoursMichael Scott Bull to be her HCPOA. Chaplain on call paged. Mardene CelesteAsaro, Adelena Desantiago I

## 2017-06-21 DIAGNOSIS — I330 Acute and subacute infective endocarditis: Secondary | ICD-10-CM

## 2017-06-21 DIAGNOSIS — Z751 Person awaiting admission to adequate facility elsewhere: Secondary | ICD-10-CM

## 2017-06-21 DIAGNOSIS — L089 Local infection of the skin and subcutaneous tissue, unspecified: Secondary | ICD-10-CM

## 2017-06-21 DIAGNOSIS — R5081 Fever presenting with conditions classified elsewhere: Secondary | ICD-10-CM

## 2017-06-21 DIAGNOSIS — Z952 Presence of prosthetic heart valve: Secondary | ICD-10-CM

## 2017-06-21 LAB — CBC
HCT: 28.5 % — ABNORMAL LOW (ref 36.0–46.0)
Hemoglobin: 9.2 g/dL — ABNORMAL LOW (ref 12.0–15.0)
MCH: 30 pg (ref 26.0–34.0)
MCHC: 32.3 g/dL (ref 30.0–36.0)
MCV: 92.8 fL (ref 78.0–100.0)
PLATELETS: 252 10*3/uL (ref 150–400)
RBC: 3.07 MIL/uL — ABNORMAL LOW (ref 3.87–5.11)
RDW: 15.4 % (ref 11.5–15.5)
WBC: 9.4 10*3/uL (ref 4.0–10.5)

## 2017-06-21 LAB — CREATININE, SERUM
CREATININE: 0.92 mg/dL (ref 0.44–1.00)
GFR calc non Af Amer: >60 mL/min (ref >60)

## 2017-06-21 MED ORDER — ZOLPIDEM TARTRATE 5 MG PO TABS
5.0000 mg | ORAL_TABLET | Freq: Once | ORAL | Status: AC
  Administered 2017-06-21: 5 mg via ORAL
  Filled 2017-06-21: qty 1

## 2017-06-21 MED ORDER — IBUPROFEN 800 MG PO TABS: 800.0000 mg | ORAL_TABLET | Freq: Four times a day (QID) | ORAL | 0 refills | Status: DC | PRN

## 2017-06-21 MED ORDER — METHADONE HCL 10 MG/ML PO CONC: 70.0000 mg | Freq: Every day | ORAL | 0 refills | Status: DC

## 2017-06-21 MED ORDER — GENTAMICIN IN SALINE 1-0.9 MG/ML-% IV SOLN: 100.0000 mg | Freq: Two times a day (BID) | INTRAVENOUS | Status: DC

## 2017-06-21 MED ORDER — LORAZEPAM 2 MG/ML IJ SOLN
1.0000 mg | Freq: Once | INTRAMUSCULAR | Status: AC
  Administered 2017-06-21: 1 mg via INTRAVENOUS
  Filled 2017-06-21: qty 1

## 2017-06-21 MED ORDER — RIFAMPIN 300 MG PO CAPS: 300.0000 mg | ORAL_CAPSULE | Freq: Three times a day (TID) | ORAL | Status: DC

## 2017-06-21 MED ORDER — ENSURE ENLIVE PO LIQD: 237.0000 mL | Freq: Three times a day (TID) | ORAL | 12 refills | Status: DC

## 2017-06-21 MED ORDER — GENTAMICIN SULFATE 40 MG/ML IJ SOLN: INTRAVENOUS | Status: DC

## 2017-06-21 MED ORDER — VANCOMYCIN HCL IN DEXTROSE 1-5 GM/200ML-% IV SOLN: 1000.0000 mg | Freq: Two times a day (BID) | INTRAVENOUS | Status: DC

## 2017-06-21 MED ORDER — DICLOFENAC SODIUM 1 % TD GEL: 2.0000 g | Freq: Four times a day (QID) | TRANSDERMAL | Status: DC

## 2017-06-21 MED ORDER — GENTAMICIN SULFATE 40 MG/ML IJ SOLN
100.0000 mg | Freq: Once | INTRAVENOUS | Status: AC
  Administered 2017-06-21: 100 mg via INTRAVENOUS
  Filled 2017-06-21: qty 2.5

## 2017-06-21 NOTE — Progress Notes (Signed)
Patient ID: Gloria Meyer, female   DOB: 1975-02-27, 42 y.o.   MRN: [id number]         [facility] for Infectious Disease  Date of Admission:  06/12/2017   Total days of antibiotics 10         ASSESSMENT: Suspect that her ongoing fever is due to her severe prosthetic, tricuspid valve endocarditis even though recent blood cultures have been negative.  I will continue her current 3 drug regimen.  Arrangements have been made for transfer to [facility] when a bed is available.    PLAN: Continue vancomycin, gentamicin and rifampin  Principal Problem:   Prosthetic valve endocarditis, subsequent encounter Active Problems:   Abscess of right leg   MRSA bacteremia   History of prosthetic tricuspid valve replacement   H/O bacterial endocarditis   Heroin abuse (HCC)   Hepatitis C   Bipolar 2 disorder (HCC)   Hypokalemia   Encounter for central line placement   IVDU (intravenous drug user)   Left hip pain   Scheduled Meds: . buPROPion  300 mg Oral Daily  . diclofenac sodium  2 g Topical QID  . divalproex  125 mg Oral QHS  . enoxaparin (LOVENOX) injection  40 mg Subcutaneous Daily  . feeding supplement (ENSURE ENLIVE)  237 mL Oral TID BM  . methadone  70 mg Oral Daily  . multivitamin with minerals  1 tablet Oral Daily  . rifampin  300 mg Oral TID  . risperiDONE  0.25 mg Oral QHS  . sertraline  50 mg Oral Daily  . sodium chloride flush  10-40 mL Intracatheter Q12H  . sodium chloride flush  10-40 mL Intracatheter Q12H   Continuous Infusions: . gentamicin 100 mg (06/21/17 1039)  . gentamicin Stopped (06/20/17 2350)  . vancomycin Stopped (06/20/17 2322)   PRN Meds:.acetaminophen **OR** acetaminophen, ibuprofen, ondansetron **OR** ondansetron (ZOFRAN) IV, sodium chloride flush, sodium chloride flush   SUBJECTIVE: She had a fever of 102.8 degrees last night associated with chills.  She says that she believes she has been having fever for the last several days because  she has been having chills but it simply was not detected.  Otherwise she is feeling better today.  She has not had any more hemoptysis in the past 24 hours.  She has not had any cough or shortness of breath today.  She has not had any diarrhea.  The multiple areas of skin infection on her lower extremities are all improving.  Review of Systems: Review of Systems  Constitutional: Positive for chills, diaphoresis and fever. Negative for malaise/fatigue and weight loss.  Respiratory: Negative for cough and hemoptysis.   Cardiovascular: Negative for chest pain.  Gastrointestinal: Negative for abdominal pain, diarrhea, nausea and vomiting.  Psychiatric/Behavioral: Positive for substance abuse. The patient is nervous/anxious.     Allergies  Allergen Reactions  . Sulfa Antibiotics Rash    OBJECTIVE: Vitals:   06/20/17 2000 06/20/17 2136 06/21/17 0541 06/21/17 0553  BP: (!) 121/101 108/70 106/75   Pulse: 74 86 65   Resp: 20 16 16    Temp: (!) 102.8 F (39.3 C) (!) 101.8 F (38.8 C)  97.7 F (36.5 C)  TempSrc: Rectal Rectal  Rectal  SpO2:  96% 99%   Weight:      Height:       Body mass index is 23.08 kg/m.  Physical Exam  Constitutional: She is oriented to person, place, and time.  She is very pleasant and in no distress.  She is up walking in her room.  She did become tearful when talking about her relapsed addiction and injecting drug use.  HENT:  Mouth/Throat: Oropharynx is clear and moist. No oropharyngeal exudate.  Eyes: Conjunctivae are normal.  Cardiovascular: Normal rate, regular rhythm and normal heart sounds.  No murmur heard. Pulmonary/Chest: Effort normal and breath sounds normal.  Abdominal: Soft. She exhibits no distension. There is no tenderness.  Musculoskeletal: She exhibits no edema or tenderness.  Neurological: She is alert and oriented to person, place, and time.  Skin:  She has multiple healing scars on both legs.  None of these appear acute or worsening.   None of the lesions have associated cellulitis, fluctuance or drainage.  Right arm PICC site looks good.    Lab Results Lab Results  Component Value Date   WBC 9.4 06/21/2017   HGB 9.2 (L) 06/21/2017   HCT 28.5 (L) 06/21/2017   MCV 92.8 06/21/2017   PLT 252 06/21/2017    Lab Results  Component Value Date   CREATININE 0.92 06/21/2017   BUN 9 06/16/2017   NA 137 06/16/2017   K 4.6 06/16/2017   CL 104 06/16/2017   CO2 24 06/16/2017    Lab Results  Component Value Date   ALT 14 06/16/2017   AST 20 06/16/2017   ALKPHOS 85 06/16/2017   BILITOT 0.7 06/16/2017     Microbiology: Recent Results (from the past 240 hour(s))  Blood Culture (routine x 2)     Status: Abnormal   Collection Time: 06/12/17 11:36 PM  Result Value Ref Range Status   Specimen Description   Final    BLOOD FEMORAL ARTERY LEFT Performed at [facility], [address] [place], [place] [address]    Special Requests   Final    BOTTLES DRAWN AEROBIC AND ANAEROBIC Blood Culture adequate volume Performed at [facility], [address] [place], [place] [address]    Culture  Setup Time   Final    GRAM POSITIVE COCCI IN BOTH AEROBIC AND ANAEROBIC BOTTLES CRITICAL RESULT CALLED TO, READ BACK BY AND VERIFIED WITH: Anette Riedel GREEN [id number] MLM Performed at [facility], [address] [place], [place] [address]    Culture METHICILLIN RESISTANT STAPHYLOCOCCUS AUREUS (A)  Final   Report Status 06/15/2017 FINAL  Final   Organism ID, Bacteria METHICILLIN RESISTANT STAPHYLOCOCCUS AUREUS  Final      Susceptibility   Methicillin resistant staphylococcus aureus - MIC*    CIPROFLOXACIN >=8 RESISTANT Resistant     ERYTHROMYCIN >=8 RESISTANT Resistant     GENTAMICIN <=0.5 SENSITIVE Sensitive     OXACILLIN >=4 RESISTANT Resistant     TETRACYCLINE <=1 SENSITIVE Sensitive     VANCOMYCIN 1 SENSITIVE Sensitive     TRIMETH/SULFA 160 RESISTANT Resistant     CLINDAMYCIN >=8 RESISTANT  Resistant     RIFAMPIN <=0.5 SENSITIVE Sensitive     Inducible Clindamycin NEGATIVE Sensitive     * METHICILLIN RESISTANT STAPHYLOCOCCUS AUREUS  Blood Culture ID Panel (Reflexed)     Status: Abnormal   Collection Time: 06/12/17 11:36 PM  Result Value Ref Range Status   Enterococcus species NOT DETECTED NOT DETECTED Final   Listeria monocytogenes NOT DETECTED NOT DETECTED Final   Staphylococcus species DETECTED (A) NOT DETECTED Final    Comment: CRITICAL RESULT CALLED TO, READ BACK BY AND VERIFIED WITH: PHARMD T GREEN [id number] MLM    Staphylococcus aureus DETECTED (A) NOT DETECTED Final  Comment: Methicillin (oxacillin)-resistant Staphylococcus aureus (MRSA). MRSA is predictably resistant to beta-lactam antibiotics (except ceftaroline). Preferred therapy is vancomycin unless clinically contraindicated. Patient requires contact precautions if  hospitalized. CRITICAL RESULT CALLED TO, READ BACK BY AND VERIFIED WITH: PHARMD T GREEN [id number] MLM    Methicillin resistance DETECTED (A) NOT DETECTED Final    Comment: CRITICAL RESULT CALLED TO, READ BACK BY AND VERIFIED WITH: PHARMD T GREEN [id number] MLM    Streptococcus species NOT DETECTED NOT DETECTED Final   Streptococcus agalactiae NOT DETECTED NOT DETECTED Final   Streptococcus pneumoniae NOT DETECTED NOT DETECTED Final   Streptococcus pyogenes NOT DETECTED NOT DETECTED Final   Acinetobacter baumannii NOT DETECTED NOT DETECTED Final   Enterobacteriaceae species NOT DETECTED NOT DETECTED Final   Enterobacter cloacae complex NOT DETECTED NOT DETECTED Final   Escherichia coli NOT DETECTED NOT DETECTED Final   Klebsiella oxytoca NOT DETECTED NOT DETECTED Final   Klebsiella pneumoniae NOT DETECTED NOT DETECTED Final   Proteus species NOT DETECTED NOT DETECTED Final   Serratia marcescens NOT DETECTED NOT DETECTED Final   Haemophilus influenzae NOT DETECTED NOT DETECTED Final   Neisseria meningitidis NOT DETECTED NOT DETECTED  Final   Pseudomonas aeruginosa NOT DETECTED NOT DETECTED Final   Candida albicans NOT DETECTED NOT DETECTED Final   Candida glabrata NOT DETECTED NOT DETECTED Final   Candida krusei NOT DETECTED NOT DETECTED Final   Candida parapsilosis NOT DETECTED NOT DETECTED Final   Candida tropicalis NOT DETECTED NOT DETECTED Final    Comment: Performed at [facility], [address] [place], [place] [address]  Blood Culture (routine x 2)     Status: Abnormal   Collection Time: 06/13/17 12:30 AM  Result Value Ref Range Status   Specimen Description   Final    BLOOD LEFT ARM UPPER Performed at [facility], [address] [place], [place] [address]    Special Requests   Final    BOTTLES DRAWN AEROBIC AND ANAEROBIC Blood Culture adequate volume Performed at [facility], [address] [place], [place] [address]    Culture  Setup Time   Final    GRAM POSITIVE COCCI IN BOTH AEROBIC AND ANAEROBIC BOTTLES    Culture (A)  Final    STAPHYLOCOCCUS AUREUS SUSCEPTIBILITIES PERFORMED ON PREVIOUS CULTURE WITHIN THE LAST 5 DAYS. Performed at [facility], [address] [place], [place] [address]    Report Status 06/15/2017 FINAL  Final  Culture, blood (Routine X 2) w Reflex to ID Panel     Status: None   Collection Time: 06/13/17  7:10 PM  Result Value Ref Range Status   Specimen Description   Final    BLOOD RIGHT FOREARM Performed at [facility], [address] [place], [place] [address]    Special Requests   Final    BOTTLES DRAWN AEROBIC ONLY Blood Culture results may not be optimal due to an inadequate volume of blood received in culture bottles Performed at [facility], [address] [place], [place] [address]    Culture   Final    NO GROWTH 5 DAYS Performed at [facility], [address] [place], [place] [address]    Report Status 06/18/2017 FINAL  Final  MRSA PCR Screening     Status: None   Collection Time:  06/15/17  8:33 AM  Result Value Ref Range Status   MRSA by PCR NEGATIVE NEGATIVE Final  Comment:        The GeneXpert MRSA Assay (FDA approved for NASAL specimens only), is one component of a comprehensive MRSA colonization surveillance program. It is not intended to diagnose MRSA infection nor to guide or monitor treatment for MRSA infections. Performed at [facility], [address], [place], [place] [address]   Culture, blood (Routine X 2) w Reflex to ID Panel     Status: None   Collection Time: 06/15/17 10:17 AM  Result Value Ref Range Status   Specimen Description   Final    BLOOD LEFT FOREARM Performed at [facility], [address] [place], [place] [address]    Special Requests   Final    BOTTLES DRAWN AEROBIC ONLY Blood Culture results may not be optimal due to an inadequate volume of blood received in culture bottles Performed at [facility], [address], [place], [place] [address]    Culture   Final    NO GROWTH 5 DAYS Performed at [facility], [address], [place], [place] [address]    Report Status 06/20/2017 FINAL  Final  Culture, blood (Routine X 2) w Reflex to ID Panel     Status: None   Collection Time: 06/15/17 10:17 AM  Result Value Ref Range Status   Specimen Description   Final    BLOOD LEFT HAND Performed at [facility], [address], [place], [place] [address]    Special Requests   Final    BOTTLES DRAWN AEROBIC ONLY Blood Culture adequate volume Performed at [facility], [address], [place], [place] [address]    Culture   Final    NO GROWTH 5 DAYS Performed at [facility], [address] [place], [place] [address]    Report Status 06/20/2017 FINAL  Final    Cliffton Asters, MD [facility] [phone] pager   [phone] cell 06/21/2017, 10:49 AM

## 2017-06-21 NOTE — Discharge Summary (Addendum)
Physician Discharge Summary  Gloria Meyer ZOX:[id number] DOB: November 03, 1975 DOA: 06/12/2017  PCP: Patient, No Pcp Per  Admit date: 06/12/2017 Discharge date: 06/21/2017  Admitted From: home  Disposition:  Transfer to [facility]    Discharge Diagnoses:  Principal Problem:   Acute bacterial endocarditis-  MRSA bacteremia Active Problems:   Abscess of right leg   H/O bacterial endocarditis with bioprosthetic tricuspid valve   Heroin abuse (HCC)   Hepatitis C   Bipolar 2 disorder (HCC)   Hypokalemia   Encounter for central line placement     Brief Summary: Gloria Meyer is a 42 y.o.femalewith medical history significant ofheroin abuse, follows with methadone clinic, bipolar 2, hepatitis C, PTSD,  Seizure disorder, MRSAendocarditis2017 s/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   She states that she relapsed about 3 months ago and has been injecting heroin into her right thigh. About 2 weeks ago, she noticed 2 sites of abscesses. She lysed it openwith a Narcan needle at home and drained white pus. Then about 5 days ago, she started to feel ill with general malaise, fatigue, fever, chills, sweats. She also admits to productive cough with yellow sputum. She denies any chest pain.     Hospital Course:  Principal Problem:   Acute bacterial endocarditis/  MRSA bacteremia/  Abscess of right leg/ Heroin abuse - TEE - shows infected Tricuspid bioprosthesis with a large vegetation - cont anibiotics per ID >> Vanc, Gent and Rifampin- see MAR - left thigh cellulitis has improved- no abscess noted on CT - PICC line has been placed due to difficult blood draws-   - she has has a cough with mild amount of bloody sputum for days but this has improved today- CXR have not shown and lung involvement with infection - she had left thigh pain yesterday- no abscess /cellulitis noted- improving  with Volatren gel - overnight has began to have fevers of 102 again- she states her temps were previously being checked orally but the 102 was checked rectally - she has been complaining of feeling hot for days-  - he has requested transfer to [facility] as this was where she had her valve replacement - our night cover doctor has arranged this (aceppting doctor> Dr Lawanda Cousins, ID)  Active Problems:  IV Heorine abuse -  Methadone change to 5 AM as she feels she was having withdrawal symptoms at this time each day - dose increased from 60 to 70 mg due to Rifampin interaction- overall doing well today  Acute thrombocytopenia - Nadir was 48 on 5/27  - due to sepsis-  improved back to normal  Candida vaginitis - Fluconazole x 1 and then Clotrimazole  - resolved   Hepatitis C - has not received treatment for this  Bipolar disorder  -Continue with Bupropion 300 mg p.o. daily as well as 50 mg of Quetiapine, Divalproex 125 mg po qHS and Sertraline 50 mg po Daily -Quetiapine being changed back to Risperdal due to interaction with Rifampin- she was taking Risperdal at home and prefers this-    Chronic grade 2 dCHF - stable   Consultants:   ID  Cardiology (for TEE)  Procedures:   2 d ECHO Study Conclusions  - Left ventricle: The cavity size was normal. Wall thickness was normal. Systolic function was normal. The estimated ejection fraction was in the range of 60% to 65%. Wall motion was normal; there were no regional wall motion abnormalities.  Features are consistent with a pseudonormal left ventricular filling pattern, with concomitant abnormal relaxation and increased filling pressure (grade 2 diastolic dysfunction). - Tricuspid valve: A bioprosthesis was present.  Impressions:  - Normal LV systolic function; moderate diastolic dysfunction; s/p TVR.   TEE 5/29 Left Ventrical:  Normal LV function, EF 60-65% Mitral Valve: mild - mod  MR , no vegetation   Aortic Valve:  Normal  Tricuspid Valve: s/p bioprosthetic TVR.   There is a large , complex vegetation involving 2 leaflets .    Pulmonic Valve:  Mild - moderate PI  Left Atrium/ Left atrial appendage: no thrombi  Atrial septum:  No ASD or PFO by color flow  Aorta: normal   Discharge Exam: Vitals:   06/21/17 0541 06/21/17 0553  BP: 106/75   Pulse: 65   Resp: 16   Temp:  97.7 F (36.5 C)  SpO2: 99%    Vitals:   06/20/17 2000 06/20/17 2136 06/21/17 0541 06/21/17 0553  BP: (!) 121/101 108/70 106/75   Pulse: 74 86 65   Resp: 20 16 16    Temp: (!) 102.8 F (39.3 C) (!) 101.8 F (38.8 C)  97.7 F (36.5 C)  TempSrc: Rectal Rectal  Rectal  SpO2:  96% 99%   Weight:      Height:        General: Pt is alert, awake, not in acute distress Cardiovascular: RRR, S1/S2 +, no rubs, no gallops Respiratory: CTA bilaterally, no wheezing, no rhonchi Abdominal: Soft, NT, ND, bowel sounds + Extremities: no edema, no cyanosis   Discharge Instructions   Allergies as of 06/21/2017      Reactions   Sulfa Antibiotics Rash      Medication List    STOP taking these medications   methadone 10 MG tablet Commonly known as:  DOLOPHINE Replaced by:  methadone 10 MG/ML solution     TAKE these medications   diclofenac sodium 1 % Gel Commonly known as:  VOLTAREN Apply 2 g topically 4 (four) times daily.   divalproex 125 MG DR tablet Commonly known as:  DEPAKOTE Take 125 mg by mouth daily.   feeding supplement (ENSURE ENLIVE) Liqd Take 237 mLs by mouth 3 (three) times daily between meals.   gentamicin 1-0.9 MG/ML-% Commonly known as:  GARAMYCIN Inject 100 mLs (100 mg total) into the vein every 12 (twelve) hours.   ibuprofen 800 MG tablet Commonly known as:  ADVIL,MOTRIN Take 1 tablet (800 mg total) by mouth every 6 (six) hours as needed for fever, headache, mild pain or moderate pain.   methadone 10 MG/ML solution Commonly known as:  DOLOPHINE Take 7 mLs (70 mg total) by mouth  daily. Start taking on:  06/22/2017 Replaces:  methadone 10 MG tablet   rifampin 300 MG capsule Commonly known as:  RIFADIN Take 1 capsule (300 mg total) by mouth 3 (three) times daily.   risperiDONE 0.25 MG tablet Commonly known as:  RISPERDAL Take 0.25 mg by mouth daily.   sertraline 50 MG tablet Commonly known as:  ZOLOFT Take 50 mg by mouth daily.   vancomycin 1-5 GM/200ML-% Soln Commonly known as:  VANCOCIN Inject 200 mLs (1,000 mg total) into the vein every 12 (twelve) hours.       Allergies  Allergen Reactions  . Sulfa Antibiotics Rash     Procedures/Studies:    Dg Chest 2 View  Result Date: 06/12/2017 CLINICAL DATA:  Femur abscess, history of heart surgery EXAM: CHEST - 2 VIEW COMPARISON:  12/20/2015  FINDINGS: Post sternotomy changes with valve prosthesis. No acute opacity or pleural effusion. Normal heart size. No pneumothorax. IMPRESSION: No active cardiopulmonary disease. Electronically Signed   By: Jasmine Pang M.D.   On: 06/12/2017 22:35   Ct Head Wo Contrast  Result Date: 06/20/2017 CLINICAL DATA:  Fever, drug abuse, endocarditis. EXAM: CT HEAD WITHOUT CONTRAST TECHNIQUE: Contiguous axial images were obtained from the base of the skull through the vertex without intravenous contrast. COMPARISON:  None. FINDINGS: BRAIN: No intraparenchymal hemorrhage, mass effect nor midline shift. The ventricles and sulci are normal. Cavum velum interpositum. No acute large vascular territory infarcts. No abnormal extra-axial fluid collections. Basal cisterns are patent. VASCULAR: Unremarkable. SKULL/SOFT TISSUES: No skull fracture. No significant soft tissue swelling. ORBITS/SINUSES: The included ocular globes and orbital contents are normal.The mastoid aircells and included paranasal sinuses are well-aerated. OTHER: None. IMPRESSION: Normal noncontrast CT HEAD. Electronically Signed   By: Awilda Metro M.D.   On: 06/20/2017 23:20   Mr Hip Left W Wo Contrast  Result Date:  06/18/2017 CLINICAL DATA:  IV drug abuser new id objects in the right thigh. Left thigh pain and swelling. EXAM: MRI OF THE LEFT HIP WITHOUT AND WITH CONTRAST TECHNIQUE: Multiplanar, multisequence MR imaging was performed both before and after administration of intravenous contrast. CONTRAST:  13 ml MULTIHANCE GADOBENATE DIMEGLUMINE 529 MG/ML IV SOLN COMPARISON:  CT right femur 06/15/2017. FINDINGS: Bones: Bone marrow signal is normal throughout. There is no evidence of osteomyelitis. No fracture or focal lesion. Articular cartilage and labrum Articular cartilage:  Normal. Labrum:  Normal. Joint or bursal effusion Joint effusion:  None. Bursae: Normal. Muscles and tendons Muscles and tendons: A small volume of fluid is seen along fascial planes bilaterally without appreciable enhancement. The appearance is symmetric from right to left. No intramuscular fluid collection is identified. Musculature is preserved. Other findings Miscellaneous: Extensive subcutaneous edema is present throughout the visualized abdomen, pelvis and upper legs. IMPRESSION: Diffuse subcutaneous edema about the imaged abdomen, throughout the pelvis and both upper legs is likely due to volume overload particularly given lack of subcutaneous enhancement. Small volume of fluid in the fascial compartments of the upper legs bilaterally appears symmetric and also likely due to volume overload. No evidence of abscess, myositis, septic joint or osteomyelitis. Electronically Signed   By: Drusilla Kanner M.D.   On: 06/18/2017 16:08   Ct Femur Right W Contrast  Result Date: 06/15/2017 CLINICAL DATA:  Drug abuser with injections in the right thigh. Right thigh pain swelling for 2 weeks. Malaise, fatigue, fever and chills. EXAM: CT OF THE LOWER RIGHT EXTREMITY WITH CONTRAST TECHNIQUE: Multidetector CT imaging of the lower right extremity was performed according to the standard protocol following intravenous contrast administration. COMPARISON:  Plain  films right upper leg 06/12/2017. CONTRAST:  100 ml ISOVUE-300 IOPAMIDOL (ISOVUE-300) INJECTION 61% FINDINGS: Bones/Joint/Cartilage Appear normal throughout without fracture, focal lesion, periosteal reaction or bony destructive change. No evidence of arthropathy about the hip or knee. Ligaments Suboptimally assessed by CT. Muscles and Tendons Intact and normal in appearance. Fat planes within muscle are preserved. No intramuscular abscess is identified. Soft tissues Mild stranding in subcutaneous fat about the thigh is noted. No focal fluid collection is seen. The patient has a trace knee joint effusion. Small Baker's cyst is noted. Imaged intrapelvic contents demonstrate a small amount of free pelvic fluid consistent with physiologic change. IMPRESSION: Mild stranding in subcutaneous fat about the right upper leg is compatible with cellulitis. Negative for abscess, myositis or osteomyelitis. Small  Baker's cyst is incidentally noted. Electronically Signed   By: Drusilla Kanner M.D.   On: 06/15/2017 07:25   Dg Chest Port 1 View  Result Date: 06/20/2017 CLINICAL DATA:  PICC line in place, s/p power flush via IV team. Pt feels her PICC line is not located correctly EXAM: PORTABLE CHEST 1 VIEW COMPARISON:  Plain film from earlier same day. FINDINGS: PICC line has been repositioned and is now adequately located with tip at the level of the mid/lower SVC. Lungs are clear. No pleural effusion or pneumothorax seen. Heart size and mediastinal contours are within normal limits. IMPRESSION: PICC line is now adequately positioned with tip at the level of the mid/lower SVC. Electronically Signed   By: Bary Richard M.D.   On: 06/20/2017 10:45   Dg Chest Port 1 View  Result Date: 06/20/2017 CLINICAL DATA:  Right arm PICC placement EXAM: PORTABLE CHEST 1 VIEW COMPARISON:  06/13/2017 FINDINGS: Internal jugular central line is been removed. Right arm PICC is placed, but passes the right neck. Previous median sternotomy and  valve replacement. Heart size remains normal. The lungs remain clear. IMPRESSION: Right arm PICC travels up the right neck, presumably in the jugular system. These results will be called to the ordering clinician or representative by the Radiologist Assistant, and communication documented in the PACS or zVision Dashboard. Electronically Signed   By: Paulina Fusi M.D.   On: 06/20/2017 06:51   Dg Chest Port 1 View  Result Date: 06/13/2017 CLINICAL DATA:  42 y/o female inpatient, encounter for central line placement. EXAM: PORTABLE CHEST - 1 VIEW COMPARISON:  06/12/2017 FINDINGS: Right IJ central line to the lower SVC. No pneumothorax. Lungs are clear. Heart size normal.  Previous median sternotomy and valve surgery. No effusion. Visualized bones unremarkable. IMPRESSION: 1. Central line to lower SVC.  No pneumothorax. Electronically Signed   By: Corlis Leak M.D.   On: 06/13/2017 15:14   Dg Femur, Min 2 Views Right  Result Date: 06/12/2017 CLINICAL DATA:  Right femur abscess near medial distal aspect of the anterior femur EXAM: RIGHT FEMUR 2 VIEWS COMPARISON:  None. FINDINGS: No acute fracture or malalignment. No periostitis or bone destruction. Soft tissue swelling distal thigh. IMPRESSION: No acute osseous abnormality Electronically Signed   By: Jasmine Pang M.D.   On: 06/12/2017 22:34   [place] Ekg Site Rite  Result Date: 06/18/2017 If Site Rite image not attached, placement could not be confirmed due to current cardiac rhythm.  [place] Ekg Site Rite  Result Date: 06/13/2017 If Site Rite image not attached, placement could not be confirmed due to current cardiac rhythm.    The results of significant diagnostics from this hospitalization (including imaging, microbiology, ancillary and laboratory) are listed below for reference.     Microbiology: Recent Results (from the past 240 hour(s))  Blood Culture (routine x 2)     Status: Abnormal   Collection Time: 06/12/17 11:36 PM  Result Value Ref Range  Status   Specimen Description   Final    BLOOD FEMORAL ARTERY LEFT Performed at [facility], [address] [place], [place] [address]    Special Requests   Final    BOTTLES DRAWN AEROBIC AND ANAEROBIC Blood Culture adequate volume Performed at [facility], [address] [place], [place] [address]    Culture  Setup Time   Final    GRAM POSITIVE COCCI IN BOTH AEROBIC AND ANAEROBIC BOTTLES CRITICAL RESULT CALLED TO, READ BACK BY  AND VERIFIED WITH: Atilano Ina [id number] MLM Performed at [facility], [address] [place], [place] [address]    Culture METHICILLIN RESISTANT STAPHYLOCOCCUS AUREUS (A)  Final   Report Status 06/15/2017 FINAL  Final   Organism ID, Bacteria METHICILLIN RESISTANT STAPHYLOCOCCUS AUREUS  Final      Susceptibility   Methicillin resistant staphylococcus aureus - MIC*    CIPROFLOXACIN >=8 RESISTANT Resistant     ERYTHROMYCIN >=8 RESISTANT Resistant     GENTAMICIN <=0.5 SENSITIVE Sensitive     OXACILLIN >=4 RESISTANT Resistant     TETRACYCLINE <=1 SENSITIVE Sensitive     VANCOMYCIN 1 SENSITIVE Sensitive     TRIMETH/SULFA 160 RESISTANT Resistant     CLINDAMYCIN >=8 RESISTANT Resistant     RIFAMPIN <=0.5 SENSITIVE Sensitive     Inducible Clindamycin NEGATIVE Sensitive     * METHICILLIN RESISTANT STAPHYLOCOCCUS AUREUS  Blood Culture ID Panel (Reflexed)     Status: Abnormal   Collection Time: 06/12/17 11:36 PM  Result Value Ref Range Status   Enterococcus species NOT DETECTED NOT DETECTED Final   Listeria monocytogenes NOT DETECTED NOT DETECTED Final   Staphylococcus species DETECTED (A) NOT DETECTED Final    Comment: CRITICAL RESULT CALLED TO, READ BACK BY AND VERIFIED WITH: PHARMD T GREEN [id number] MLM    Staphylococcus aureus DETECTED (A) NOT DETECTED Final    Comment: Methicillin (oxacillin)-resistant Staphylococcus aureus (MRSA). MRSA is predictably resistant to beta-lactam antibiotics (except ceftaroline).  Preferred therapy is vancomycin unless clinically contraindicated. Patient requires contact precautions if  hospitalized. CRITICAL RESULT CALLED TO, READ BACK BY AND VERIFIED WITH: PHARMD T GREEN [id number] MLM    Methicillin resistance DETECTED (A) NOT DETECTED Final    Comment: CRITICAL RESULT CALLED TO, READ BACK BY AND VERIFIED WITH: PHARMD T GREEN [id number] [id number] MLM    Streptococcus species NOT DETECTED NOT DETECTED Final   Streptococcus agalactiae NOT DETECTED NOT DETECTED Final   Streptococcus pneumoniae NOT DETECTED NOT DETECTED Final   Streptococcus pyogenes NOT DETECTED NOT DETECTED Final   Acinetobacter baumannii NOT DETECTED NOT DETECTED Final   Enterobacteriaceae species NOT DETECTED NOT DETECTED Final   Enterobacter cloacae complex NOT DETECTED NOT DETECTED Final   Escherichia coli NOT DETECTED NOT DETECTED Final   Klebsiella oxytoca NOT DETECTED NOT DETECTED Final   Klebsiella pneumoniae NOT DETECTED NOT DETECTED Final   Proteus species NOT DETECTED NOT DETECTED Final   Serratia marcescens NOT DETECTED NOT DETECTED Final   Haemophilus influenzae NOT DETECTED NOT DETECTED Final   Neisseria meningitidis NOT DETECTED NOT DETECTED Final   Pseudomonas aeruginosa NOT DETECTED NOT DETECTED Final   Candida albicans NOT DETECTED NOT DETECTED Final   Candida glabrata NOT DETECTED NOT DETECTED Final   Candida krusei NOT DETECTED NOT DETECTED Final   Candida parapsilosis NOT DETECTED NOT DETECTED Final   Candida tropicalis NOT DETECTED NOT DETECTED Final    Comment: Performed at [facility], [address] [place], [place] [address]  Blood Culture (routine x 2)     Status: Abnormal   Collection Time: 06/13/17 12:30 AM  Result Value Ref Range Status   Specimen Description   Final    BLOOD LEFT ARM UPPER Performed at [facility], [address] [place], [place] [address]    Special Requests   Final    BOTTLES DRAWN AEROBIC AND ANAEROBIC Blood Culture adequate  volume Performed at [facility], [address] [place],  [place] [address]    Culture  Setup Time   Final    GRAM POSITIVE COCCI IN BOTH AEROBIC AND ANAEROBIC BOTTLES    Culture (A)  Final    STAPHYLOCOCCUS AUREUS SUSCEPTIBILITIES PERFORMED ON PREVIOUS CULTURE WITHIN THE LAST 5 DAYS. Performed at [facility], [address] [place], [place] [address]    Report Status 06/15/2017 FINAL  Final  Culture, blood (Routine X 2) w Reflex to ID Panel     Status: None   Collection Time: 06/13/17  7:10 PM  Result Value Ref Range Status   Specimen Description   Final    BLOOD RIGHT FOREARM Performed at [facility], [address], [place], [place] [address]    Special Requests   Final    BOTTLES DRAWN AEROBIC ONLY Blood Culture results may not be optimal due to an inadequate volume of blood received in culture bottles Performed at [facility], [address], [place], [place] [address]    Culture   Final    NO GROWTH 5 DAYS Performed at [facility], [address] [place], [place] [address]    Report Status 06/18/2017 FINAL  Final  MRSA PCR Screening     Status: None   Collection Time: 06/15/17  8:33 AM  Result Value Ref Range Status   MRSA by PCR NEGATIVE NEGATIVE Final    Comment:        The GeneXpert MRSA Assay (FDA approved for NASAL specimens only), is one component of a comprehensive MRSA colonization surveillance program. It is not intended to diagnose MRSA infection nor to guide or monitor treatment for MRSA infections. Performed at [facility], [address], [place], [place] [address]   Culture, blood (Routine X 2) w Reflex to ID Panel     Status: None   Collection Time: 06/15/17 10:17 AM  Result Value Ref Range Status   Specimen Description   Final    BLOOD LEFT FOREARM Performed at [facility], [address], [place], [place] [address]    Special Requests   Final     BOTTLES DRAWN AEROBIC ONLY Blood Culture results may not be optimal due to an inadequate volume of blood received in culture bottles Performed at [facility], [address] [place], [place] [address]    Culture   Final    NO GROWTH 5 DAYS Performed at [facility], [address], [place], [place] [address]    Report Status 06/20/2017 FINAL  Final  Culture, blood (Routine X 2) w Reflex to ID Panel     Status: None   Collection Time: 06/15/17 10:17 AM  Result Value Ref Range Status   Specimen Description   Final    BLOOD LEFT HAND Performed at [facility], [address], [place], [place] [address]    Special Requests   Final    BOTTLES DRAWN AEROBIC ONLY Blood Culture adequate volume Performed at [facility], [address] [place], [place] [address]    Culture   Final    NO GROWTH 5 DAYS Performed at [facility], [address], [place], [place] [address]    Report Status 06/20/2017 FINAL  Final     Labs: BNP (last 3 results) No results for input(s): BNP in the last 8760 hours. Basic Metabolic Panel: Recent Labs  Lab 06/15/17 1017 06/16/17 0436 06/16/17 0347 06/17/17 4259 06/18/17 0425  06/19/17 0727 06/20/17 0347 06/21/17 0412  NA 136 137  --   --   --   --   --   --   K 3.2* 4.6  --   --   --   --   --   --   CL 104 104  --   --   --   --   --   --   CO2 23 24  --   --   --   --   --   --   GLUCOSE 118* 74  --   --   --   --   --   --   BUN 9 9  --   --   --   --   --   --   CREATININE 0.80 0.72  --  0.79 0.73 0.74 0.87 0.92  CALCIUM 8.0* 8.3*  --   --   --   --   --   --   MG 1.8 1.8  --   --   --   --   --   --   PHOS 4.2  --  3.8  --   --   --   --   --    Liver Function Tests: Recent Labs  Lab 06/15/17 1017 06/16/17 0436  AST 15 20  ALT 13* 14  ALKPHOS 73 85  BILITOT 0.8 0.7  PROT 5.6* 6.6  ALBUMIN 2.1* 2.4*   No results for input(s): LIPASE, AMYLASE in the last 168  hours. No results for input(s): AMMONIA in the last 168 hours. CBC: Recent Labs  Lab 06/15/17 1017 06/16/17 0436 06/18/17 0425 06/21/17 0412  WBC 4.1 4.0 6.5 9.4  NEUTROABS 2.7 2.9  --   --   HGB 10.1* 11.8* 9.4* 9.2*  HCT 30.5* 35.9* 29.1* 28.5*  MCV 92.4 91.8 93.6 92.8  PLT 56* 71* 112* 252   Cardiac Enzymes: No results for input(s): CKTOTAL, CKMB, CKMBINDEX, TROPONINI in the last 168 hours. BNP: Invalid input(s): POCBNP CBG: No results for input(s): GLUCAP in the last 168 hours. D-Dimer No results for input(s): DDIMER in the last 72 hours. Hgb A1c No results for input(s): HGBA1C in the last 72 hours. Lipid Profile No results for input(s): CHOL, HDL, LDLCALC, TRIG, CHOLHDL, LDLDIRECT in the last 72 hours. Thyroid function studies No results for input(s): TSH, T4TOTAL, T3FREE, THYROIDAB in the last 72 hours.  Invalid input(s): FREET3 Anemia work up No results for input(s): VITAMINB12, FOLATE, FERRITIN, TIBC, IRON, RETICCTPCT in the last 72 hours. Urinalysis    Component Value Date/Time   COLORURINE YELLOW 06/13/2017 0540   APPEARANCEUR CLOUDY (A) 06/13/2017 0540   LABSPEC <1.005 (L) 06/13/2017 0540   PHURINE 6.5 06/13/2017 0540   GLUCOSEU NEGATIVE 06/13/2017 0540   HGBUR MODERATE (A) 06/13/2017 0540   BILIRUBINUR NEGATIVE 06/13/2017 0540   KETONESUR NEGATIVE 06/13/2017 0540   PROTEINUR NEGATIVE 06/13/2017 0540   NITRITE NEGATIVE 06/13/2017 0540   LEUKOCYTESUR NEGATIVE 06/13/2017 0540   Sepsis Labs Invalid input(s): PROCALCITONIN,  WBC,  LACTICIDVEN Microbiology Recent Results (from the past 240 hour(s))  Blood Culture (routine x 2)     Status: Abnormal   Collection Time: 06/12/17 11:36 PM  Result Value Ref Range Status   Specimen Description   Final    BLOOD FEMORAL ARTERY LEFT Performed at [facility], [address] [place], [place] [id number]    Special Requests   Final    BOTTLES  DRAWN AEROBIC AND ANAEROBIC Blood Culture adequate  volume Performed at [facility], [address] [place], [place] [address]    Culture  Setup Time   Final    GRAM POSITIVE COCCI IN BOTH AEROBIC AND ANAEROBIC BOTTLES CRITICAL RESULT CALLED TO, READ BACK BY AND VERIFIED WITH: Anette Riedel GREEN [id number] MLM Performed at [facility], [address] [place], [place] [address]    Culture METHICILLIN RESISTANT STAPHYLOCOCCUS AUREUS (A)  Final   Report Status 06/15/2017 FINAL  Final   Organism ID, Bacteria METHICILLIN RESISTANT STAPHYLOCOCCUS AUREUS  Final      Susceptibility   Methicillin resistant staphylococcus aureus - MIC*    CIPROFLOXACIN >=8 RESISTANT Resistant     ERYTHROMYCIN >=8 RESISTANT Resistant     GENTAMICIN <=0.5 SENSITIVE Sensitive     OXACILLIN >=4 RESISTANT Resistant     TETRACYCLINE <=1 SENSITIVE Sensitive     VANCOMYCIN 1 SENSITIVE Sensitive     TRIMETH/SULFA 160 RESISTANT Resistant     CLINDAMYCIN >=8 RESISTANT Resistant     RIFAMPIN <=0.5 SENSITIVE Sensitive     Inducible Clindamycin NEGATIVE Sensitive     * METHICILLIN RESISTANT STAPHYLOCOCCUS AUREUS  Blood Culture ID Panel (Reflexed)     Status: Abnormal   Collection Time: 06/12/17 11:36 PM  Result Value Ref Range Status   Enterococcus species NOT DETECTED NOT DETECTED Final   Listeria monocytogenes NOT DETECTED NOT DETECTED Final   Staphylococcus species DETECTED (A) NOT DETECTED Final    Comment: CRITICAL RESULT CALLED TO, READ BACK BY AND VERIFIED WITH: PHARMD T GREEN [id number] MLM    Staphylococcus aureus DETECTED (A) NOT DETECTED Final    Comment: Methicillin (oxacillin)-resistant Staphylococcus aureus (MRSA). MRSA is predictably resistant to beta-lactam antibiotics (except ceftaroline). Preferred therapy is vancomycin unless clinically contraindicated. Patient requires contact precautions if  hospitalized. CRITICAL RESULT CALLED TO, READ BACK BY AND VERIFIED WITH: PHARMD T GREEN [id number] MLM    Methicillin resistance DETECTED  (A) NOT DETECTED Final    Comment: CRITICAL RESULT CALLED TO, READ BACK BY AND VERIFIED WITH: PHARMD T GREEN [id number] MLM    Streptococcus species NOT DETECTED NOT DETECTED Final   Streptococcus agalactiae NOT DETECTED NOT DETECTED Final   Streptococcus pneumoniae NOT DETECTED NOT DETECTED Final   Streptococcus pyogenes NOT DETECTED NOT DETECTED Final   Acinetobacter baumannii NOT DETECTED NOT DETECTED Final   Enterobacteriaceae species NOT DETECTED NOT DETECTED Final   Enterobacter cloacae complex NOT DETECTED NOT DETECTED Final   Escherichia coli NOT DETECTED NOT DETECTED Final   Klebsiella oxytoca NOT DETECTED NOT DETECTED Final   Klebsiella pneumoniae NOT DETECTED NOT DETECTED Final   Proteus species NOT DETECTED NOT DETECTED Final   Serratia marcescens NOT DETECTED NOT DETECTED Final   Haemophilus influenzae NOT DETECTED NOT DETECTED Final   Neisseria meningitidis NOT DETECTED NOT DETECTED Final   Pseudomonas aeruginosa NOT DETECTED NOT DETECTED Final   Candida albicans NOT DETECTED NOT DETECTED Final   Candida glabrata NOT DETECTED NOT DETECTED Final   Candida krusei NOT DETECTED NOT DETECTED Final   Candida parapsilosis NOT DETECTED NOT DETECTED Final   Candida tropicalis NOT DETECTED NOT DETECTED Final    Comment: Performed at [facility], [address] [place], [place] [address]  Blood Culture (routine x 2)     Status: Abnormal   Collection Time: 06/13/17 12:30 AM  Result Value Ref Range Status   Specimen Description   Final  BLOOD LEFT ARM UPPER Performed at [facility], [address] [place], [place] [address]    Special Requests   Final    BOTTLES DRAWN AEROBIC AND ANAEROBIC Blood Culture adequate volume Performed at [facility], [address] [place], [place] [address]    Culture  Setup Time   Final    GRAM POSITIVE COCCI IN BOTH AEROBIC AND ANAEROBIC BOTTLES    Culture (A)  Final    STAPHYLOCOCCUS  AUREUS SUSCEPTIBILITIES PERFORMED ON PREVIOUS CULTURE WITHIN THE LAST 5 DAYS. Performed at [facility], [address], [place], [place] [address]    Report Status 06/15/2017 FINAL  Final  Culture, blood (Routine X 2) w Reflex to ID Panel     Status: None   Collection Time: 06/13/17  7:10 PM  Result Value Ref Range Status   Specimen Description   Final    BLOOD RIGHT FOREARM Performed at [facility], [address], [place], [place] [address]    Special Requests   Final    BOTTLES DRAWN AEROBIC ONLY Blood Culture results may not be optimal due to an inadequate volume of blood received in culture bottles Performed at [facility], [address] [place], [place] [address]    Culture   Final    NO GROWTH 5 DAYS Performed at [facility], [address] [place], [place] [address]    Report Status 06/18/2017 FINAL  Final  MRSA PCR Screening     Status: None   Collection Time: 06/15/17  8:33 AM  Result Value Ref Range Status   MRSA by PCR NEGATIVE NEGATIVE Final    Comment:        The GeneXpert MRSA Assay (FDA approved for NASAL specimens only), is one component of a comprehensive MRSA colonization surveillance program. It is not intended to diagnose MRSA infection nor to guide or monitor treatment for MRSA infections. Performed at [facility], [address] [place], [place] [address]   Culture, blood (Routine X 2) w Reflex to ID Panel     Status: None   Collection Time: 06/15/17 10:17 AM  Result Value Ref Range Status   Specimen Description   Final    BLOOD LEFT FOREARM Performed at [facility], [address] [place], [place] [address]    Special Requests   Final    BOTTLES DRAWN AEROBIC ONLY Blood Culture results may not be optimal due to an inadequate volume of blood received in culture bottles Performed at [facility], [address], [place], [place] [address]     Culture   Final    NO GROWTH 5 DAYS Performed at [facility], [address] [place], [place] [address]    Report Status 06/20/2017 FINAL  Final  Culture, blood (Routine X 2) w Reflex to ID Panel     Status: None   Collection Time: 06/15/17 10:17 AM  Result Value Ref Range Status   Specimen Description   Final    BLOOD LEFT HAND Performed at [facility], [address], [place], [place] [address]    Special Requests   Final    BOTTLES DRAWN AEROBIC ONLY Blood Culture adequate volume Performed at [facility], [address], [place], [place] [address]    Culture   Final    NO GROWTH 5 DAYS Performed at [facility], [address] [place], [place] [address]    Report Status 06/20/2017 FINAL  Final     Time coordinating discharge in minutes: 60  SIGNED:   Calvert Cantor, MD  [facility] 06/21/2017, 8:02 AM Pager   If 7PM-7AM, please contact night-coverage [email or web address] Password [email or web address]

## 2017-06-21 NOTE — Progress Notes (Signed)
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 Merdis DelayK. Schorr, [facility] floor coverage paged.

## 2017-06-22 LAB — GENTAMICIN LEVEL, PEAK: Gentamicin Pk: 6.2 ug/mL (ref 5.0–10.0)

## 2017-06-22 LAB — CREATININE, SERUM
CREATININE: 0.88 mg/dL (ref 0.44–1.00)
GFR calc non Af Amer: >60 mL/min (ref >60)

## 2017-06-22 LAB — VANCOMYCIN, PEAK: Vancomycin Pk: 38 ug/mL (ref 30–40)

## 2017-06-22 LAB — VANCOMYCIN, TROUGH: VANCOMYCIN TR: 19 ug/mL (ref 15–20)

## 2017-06-22 MED ORDER — LORAZEPAM 2 MG/ML IJ SOLN
1.0000 mg | Freq: Once | INTRAMUSCULAR | Status: AC
Start: 2017-06-22 — End: 2017-06-22
  Administered 2017-06-22: 1 mg via INTRAVENOUS
  Filled 2017-06-22: qty 1

## 2017-06-22 MED ORDER — ZOLPIDEM TARTRATE 5 MG PO TABS
5.0000 mg | ORAL_TABLET | Freq: Every evening | ORAL | Status: DC | PRN
  Administered 2017-06-23: 5 mg via ORAL
  Filled 2017-06-22 (×2): qty 1

## 2017-06-22 NOTE — Progress Notes (Signed)
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   She states that she relapsed about 3 months ago and has been injecting heroin into her right thigh. About 2 weeks ago, she noticed 2 sites of abscess. She lysed it openwith a Narcan needle at home and drained white pus that was mixed with brown. Then about 5 days ago, she started to feel ill with general malaise, fatigue, fever, chills, sweats. She also admits to productive cough of yellow sputum. She denies any chest pain. Has some nausea and has not been able to keep down any food for the past 5 days.    Subjective: Continues to states she feels withdrawal symptoms in the AM. This is when she is spiking fevers. Right lower thigh still hurts but she has not been applying Voltaren gel. Ambulating without trouble.    Assessment & Plan:   Principal Problem:   Acute bacterial endocarditis/  MRSA bacteremia/  Abscess of right leg/ Heroin abuse - TEE - shows infected Tricuspid bioprosthesis with a large vegetation - cont anibiotics per ID  - left thigh cellulitis has improved- no abscess noted on CT - PICC line has been placed due to difficult blood draws- was in right IJ, corrected and now in SVC  Active Problems:  IV Heorine abuse - cont Methadone at current dose- no signs of withdrawal- change to 5 AM is helping but she still has a runny nose and sneezing -l increased to 70 mg 6/3 due to interaction with Rifampin - will not change dose  Acute thrombocytopenia - due to sepsis-  Improved to  normal  Candida vaginitis - Fluconazole x 1 and then Clotrimazole  - resolved    Hepatitis C - has not received treatment for this  Bipolar disorder  -Continue with Bupropion 300 mg p.o. daily as well as 50 mg of Quetiapine, Divalproex 125 mg po qHS and Sertraline 50 mg po Daily -Quetiapine being changed back to Risperdal due to interaction with Rifampin- she was taking Risperdal at home and prefers this-     Chronic grade 2 dCHF - stable    DVT prophylaxis: Lovenox Code Status: full code Family Communication:  Disposition Plan: follow on tele Consultants:   ID  Cardiology (for TEE)  Procedures:   2 d ECHO Study Conclusions  - Left ventricle: The cavity size was normal. Wall thickness was normal. Systolic function was normal. The estimated ejection fraction was in the range of 60% to 65%. Wall motion was normal; there were no regional wall motion abnormalities. Features are consistent with a pseudonormal left ventricular filling pattern, with concomitant abnormal relaxation and increased filling pressure (grade 2 diastolic dysfunction). - Tricuspid valve: A bioprosthesis was present.  Impressions:  - Normal LV systolic function; moderate diastolic dysfunction; s/p TVR.   TEE 5/29 Antimicrobials:  Anti-infectives (From admission, onward)   Start     Dose/Rate Route Frequency Ordered Stop   06/21/17 1000  dextrose 5 % 50 mL with gentamicin (GARAMYCIN) 100 mg infusion  Status:  Discontinued     210 mL/hr over 30 Minutes Intravenous Continuous 06/21/17 0906 06/21/17 0909   06/21/17 1000  gentamicin (GARAMYCIN) 100 mg in dextrose 5 % 100 mL IVPB     100 mg 205 mL/hr over 30 Minutes Intravenous  Once 06/21/17 0907 06/21/17 1109   06/21/17 0900  gentamicin (GARAMYCIN) IVPB 100 mg  Status:  Discontinued     100 mg 200 mL/hr over 30 Minutes Intravenous Every 12 hours 06/21/17 0857 06/21/17 0859   06/21/17 0000  gentamicin (GARAMYCIN) 1-0.9 MG/ML-%      100 mg 200 mL/hr over 30 Minutes Intravenous Every 12 hours 06/21/17 0802     06/21/17 0000  rifampin (RIFADIN) 300 MG capsule     300 mg Oral 3 times daily 06/21/17 0802     06/21/17 0000  vancomycin (VANCOCIN) 1-5 GM/200ML-% SOLN     1,000 mg 200 mL/hr over 60 Minutes Intravenous Every 12 hours 06/21/17 0802     06/18/17 2200  gentamicin (GARAMYCIN) IVPB 100 mg     100 mg 200 mL/hr over 30 Minutes Intravenous Every 12 hours 06/18/17 1043     06/18/17 2200  vancomycin (VANCOCIN) IVPB 1000 mg/200 mL premix     1,000 mg 200 mL/hr over 60 Minutes Intravenous Every 12 hours 06/18/17 1524     06/18/17 1100  gentamicin (GARAMYCIN) IVPB 100 mg     100 mg 200 mL/hr over 30 Minutes Intravenous  Once 06/18/17 1041 06/18/17 1224   06/17/17 1000  rifampin (RIFADIN) capsule 300 mg     300 mg Oral 3 times daily 06/17/17 0916 07/29/17 0959   06/17/17 1000  gentamicin (GARAMYCIN) 100 mg in dextrose 5 % 50 mL IVPB  Status:  Discontinued     100 mg 105 mL/hr over 30 Minutes Intravenous Every 12 hours 06/17/17 0934 06/18/17 1043   06/16/17 2200  gentamicin (GARAMYCIN) IVPB 100 mg  Status:  Discontinued     100 mg 200 mL/hr over 30 Minutes Intravenous Every 12 hours 06/16/17 1931 06/17/17 0934   06/15/17 1100  fluconazole (DIFLUCAN) tablet 150 mg     150 mg Oral  Once 06/15/17 0952 06/15/17 1010   06/15/17 1000  vancomycin (VANCOCIN) IVPB 750 mg/150 ml premix  Status:  Discontinued     750 mg 150 mL/hr over 60 Minutes Intravenous Every 12 hours 06/15/17 0749 06/18/17 1524   06/13/17 2200  vancomycin (VANCOCIN) IVPB 1000 mg/200 mL premix  Status:  Discontinued     1,000 mg 200 mL/hr over 60 Minutes Intravenous Every 24 hours 06/13/17 0852 06/15/17 0749   06/13/17 0830  piperacillin-tazobactam (ZOSYN) IVPB 3.375 g  Status:  Discontinued     3.375 g 12.5 mL/hr over 240 Minutes Intravenous Every 8 hours 06/13/17 0823 06/13/17 1555   06/13/17 0030  rifampin (RIFADIN) 300 mg in sodium chloride 0.9 %  100 mL IVPB  Status:  Discontinued     300 mg 200 mL/hr over 30 Minutes Intravenous Every 8 hours 06/13/17 0017 06/14/17 1416   06/13/17 0030  piperacillin-tazobactam (ZOSYN) IVPB 3.375 g     3.375 g 100 mL/hr over 30 Minutes Intravenous  Once 06/13/17 0017 06/13/17 0147   06/13/17 0030  vancomycin (VANCOCIN) IVPB 1000 mg/200 mL premix     1,000 mg 200 mL/hr over 60 Minutes Intravenous  Once 06/13/17 0027 06/13/17 0216       Objective: Vitals:   06/21/17 2102 06/22/17 0500 06/22/17 0820 06/22/17 0825  BP: 113/81 120/75    Pulse: 76 80    Resp: 18 18    Temp: 98 F (36.7 C) 98.2 F (36.8 C) 99  F (37.2 C) (!) 101.6 F (38.7 C)  TempSrc: Oral Oral Oral Rectal  SpO2: 100% 100%    Weight:      Height:        Intake/Output Summary (Last 24 hours) at 06/22/2017 1212 Last data filed at 06/22/2017 0859 Gross per 24 hour  Intake 1510 ml  Output -  Net 1510 ml   Filed Weights   06/12/17 2142 06/13/17 0635 06/16/17 1258  Weight: 61.6 kg (135 lb 11.2 oz) 62.9 kg (138 lb 10.7 oz) 62.9 kg (138 lb 10.7 oz)    Examination: General exam: Appears comfortable  HEENT: PERRLA, oral mucosa moist, no sclera icterus or thrush Respiratory system: Clear to auscultation. Respiratory effort normal. Cardiovascular system: S1 & S2 heard,  No murmurs  Gastrointestinal system: Abdomen soft, non-tender, nondistended. Normal bowel sound. No organomegaly Central nervous system: Alert and oriented. No focal neurological deficits. Extremities: No cyanosis, clubbing or edema Musculoskeletal: tenderness in right out/lower thigh- no swelling or rash Skin: numerous ulcers and bruises Psychiatry:  Mood & affect appropriate.     Data Reviewed: I have personally reviewed following labs and imaging studies  CBC: Recent Labs  Lab 06/16/17 0436 06/18/17 0425 06/21/17 0412  WBC 4.0 6.5 9.4  NEUTROABS 2.9  --   --   HGB 11.8* 9.4* 9.2*  HCT 35.9* 29.1* 28.5*  MCV 91.8 93.6 92.8  PLT 71* 112* 252    Basic Metabolic Panel: Recent Labs  Lab 06/16/17 0436 06/16/17 0504  06/18/17 0425 06/19/17 0727 06/20/17 0347 06/21/17 0412 06/22/17 0457  NA 137  --   --   --   --   --   --   --   K 4.6  --   --   --   --   --   --   --   CL 104  --   --   --   --   --   --   --   CO2 24  --   --   --   --   --   --   --   GLUCOSE 74  --   --   --   --   --   --   --   BUN 9  --   --   --   --   --   --   --   CREATININE 0.72  --    < > 0.73 0.74 0.87 0.92 0.88  CALCIUM 8.3*  --   --   --   --   --   --   --   MG 1.8  --   --   --   --   --   --   --   PHOS  --  3.8  --   --   --   --   --   --    < > = values in this interval not displayed.   GFR: Estimated Creatinine Clearance: 76.5 mL/min (by C-G formula based on SCr of 0.88 mg/dL). Liver Function Tests: Recent Labs  Lab 06/16/17 0436  AST 20  ALT 14  ALKPHOS 85  BILITOT 0.7  PROT 6.6  ALBUMIN 2.4*   No results for input(s): LIPASE, AMYLASE in the last 168 hours. No results for input(s): AMMONIA in the last 168 hours. Coagulation Profile: No results for input(s): INR, PROTIME in the last 168 hours. Cardiac Enzymes: No results for input(s): CKTOTAL, CKMB, CKMBINDEX, TROPONINI in  the last 168 hours. BNP (last 3 results) No results for input(s): PROBNP in the last 8760 hours. HbA1C: No results for input(s): HGBA1C in the last 72 hours. CBG: No results for input(s): GLUCAP in the last 168 hours. Lipid Profile: No results for input(s): CHOL, HDL, LDLCALC, TRIG, CHOLHDL, LDLDIRECT in the last 72 hours. Thyroid Function Tests: No results for input(s): TSH, T4TOTAL, FREET4, T3FREE, THYROIDAB in the last 72 hours. Anemia Panel: No results for input(s): VITAMINB12, FOLATE, FERRITIN, TIBC, IRON, RETICCTPCT in the last 72 hours. Urine analysis:    Component Value Date/Time   COLORURINE YELLOW 06/13/2017 0540   APPEARANCEUR CLOUDY (A) 06/13/2017 0540   LABSPEC <1.005 (L) 06/13/2017 0540   PHURINE 6.5 06/13/2017 0540   GLUCOSEU  NEGATIVE 06/13/2017 0540   HGBUR MODERATE (A) 06/13/2017 0540   BILIRUBINUR NEGATIVE 06/13/2017 0540   KETONESUR NEGATIVE 06/13/2017 0540   PROTEINUR NEGATIVE 06/13/2017 0540   NITRITE NEGATIVE 06/13/2017 0540   LEUKOCYTESUR NEGATIVE 06/13/2017 0540   Sepsis Labs: @LABRCNTIP (procalcitonin:4,lacticidven:4) ) Recent Results (from the past 240 hour(s))  Blood Culture (routine x 2)     Status: Abnormal   Collection Time: 06/12/17 11:36 PM  Result Value Ref Range Status   Specimen Description   Final    BLOOD FEMORAL ARTERY LEFT Performed at [facility], [address] [place], [place] [address]    Special Requests   Final    BOTTLES DRAWN AEROBIC AND ANAEROBIC Blood Culture adequate volume Performed at [facility], [address] [place], [place] [address]    Culture  Setup Time   Final    GRAM POSITIVE COCCI IN BOTH AEROBIC AND ANAEROBIC BOTTLES CRITICAL RESULT CALLED TO, READ BACK BY AND VERIFIED WITH: Anette Riedel GREEN [id number] MLM Performed at [facility], [address] [place], [place] [address]    Culture METHICILLIN RESISTANT STAPHYLOCOCCUS AUREUS (A)  Final   Report Status 06/15/2017 FINAL  Final   Organism ID, Bacteria METHICILLIN RESISTANT STAPHYLOCOCCUS AUREUS  Final      Susceptibility   Methicillin resistant staphylococcus aureus - MIC*    CIPROFLOXACIN >=8 RESISTANT Resistant     ERYTHROMYCIN >=8 RESISTANT Resistant     GENTAMICIN <=0.5 SENSITIVE Sensitive     OXACILLIN >=4 RESISTANT Resistant     TETRACYCLINE <=1 SENSITIVE Sensitive     VANCOMYCIN 1 SENSITIVE Sensitive     TRIMETH/SULFA 160 RESISTANT Resistant     CLINDAMYCIN >=8 RESISTANT Resistant     RIFAMPIN <=0.5 SENSITIVE Sensitive     Inducible Clindamycin NEGATIVE Sensitive     * METHICILLIN RESISTANT STAPHYLOCOCCUS AUREUS  Blood Culture ID Panel (Reflexed)     Status: Abnormal   Collection Time: 06/12/17 11:36 PM  Result Value Ref Range Status   Enterococcus  species NOT DETECTED NOT DETECTED Final   Listeria monocytogenes NOT DETECTED NOT DETECTED Final   Staphylococcus species DETECTED (A) NOT DETECTED Final    Comment: CRITICAL RESULT CALLED TO, READ BACK BY AND VERIFIED WITH: PHARMD T GREEN [id number] MLM    Staphylococcus aureus DETECTED (A) NOT DETECTED Final    Comment: Methicillin (oxacillin)-resistant Staphylococcus aureus (MRSA). MRSA is predictably resistant to beta-lactam antibiotics (except ceftaroline). Preferred therapy is vancomycin unless clinically contraindicated. Patient requires contact precautions if  hospitalized. CRITICAL RESULT CALLED TO, READ BACK BY AND VERIFIED WITH: PHARMD T GREEN [id number] MLM    Methicillin resistance DETECTED (A) NOT DETECTED Final    Comment: CRITICAL RESULT CALLED  TO, READ BACK BY AND VERIFIED WITH: PHARMD T GREEN [id number] MLM    Streptococcus species NOT DETECTED NOT DETECTED Final   Streptococcus agalactiae NOT DETECTED NOT DETECTED Final   Streptococcus pneumoniae NOT DETECTED NOT DETECTED Final   Streptococcus pyogenes NOT DETECTED NOT DETECTED Final   Acinetobacter baumannii NOT DETECTED NOT DETECTED Final   Enterobacteriaceae species NOT DETECTED NOT DETECTED Final   Enterobacter cloacae complex NOT DETECTED NOT DETECTED Final   Escherichia coli NOT DETECTED NOT DETECTED Final   Klebsiella oxytoca NOT DETECTED NOT DETECTED Final   Klebsiella pneumoniae NOT DETECTED NOT DETECTED Final   Proteus species NOT DETECTED NOT DETECTED Final   Serratia marcescens NOT DETECTED NOT DETECTED Final   Haemophilus influenzae NOT DETECTED NOT DETECTED Final   Neisseria meningitidis NOT DETECTED NOT DETECTED Final   Pseudomonas aeruginosa NOT DETECTED NOT DETECTED Final   Candida albicans NOT DETECTED NOT DETECTED Final   Candida glabrata NOT DETECTED NOT DETECTED Final   Candida krusei NOT DETECTED NOT DETECTED Final   Candida parapsilosis NOT DETECTED NOT DETECTED Final   Candida  tropicalis NOT DETECTED NOT DETECTED Final    Comment: Performed at [facility], [address] [place], [place] [address]  Blood Culture (routine x 2)     Status: Abnormal   Collection Time: 06/13/17 12:30 AM  Result Value Ref Range Status   Specimen Description   Final    BLOOD LEFT ARM UPPER Performed at [facility], [address] [place], [place] [address]    Special Requests   Final    BOTTLES DRAWN AEROBIC AND ANAEROBIC Blood Culture adequate volume Performed at [facility], [address] [place], [place] [address]    Culture  Setup Time   Final    GRAM POSITIVE COCCI IN BOTH AEROBIC AND ANAEROBIC BOTTLES    Culture (A)  Final    STAPHYLOCOCCUS AUREUS SUSCEPTIBILITIES PERFORMED ON PREVIOUS CULTURE WITHIN THE LAST 5 DAYS. Performed at [facility], [address], [place], [place] [address]    Report Status 06/15/2017 FINAL  Final  Culture, blood (Routine X 2) w Reflex to ID Panel     Status: None   Collection Time: 06/13/17  7:10 PM  Result Value Ref Range Status   Specimen Description   Final    BLOOD RIGHT FOREARM Performed at [facility], [address] [place], [place] [address]    Special Requests   Final    BOTTLES DRAWN AEROBIC ONLY Blood Culture results may not be optimal due to an inadequate volume of blood received in culture bottles Performed at [facility], [address], [place], [place] [address]    Culture   Final    NO GROWTH 5 DAYS Performed at [facility], [address], [place], [place] [address]    Report Status 06/18/2017 FINAL  Final  MRSA PCR Screening     Status: None   Collection Time: 06/15/17  8:33 AM  Result Value Ref Range Status   MRSA by PCR NEGATIVE NEGATIVE Final    Comment:        The GeneXpert MRSA Assay (FDA approved for NASAL specimens only), is one component of a comprehensive MRSA colonization surveillance program. It is not intended to diagnose  MRSA infection nor to guide or monitor treatment for MRSA infections. Performed at [facility], [address] [place], [place] [address]  Culture, blood (Routine X 2) w Reflex to ID Panel     Status: None   Collection Time: 06/15/17 10:17 AM  Result Value Ref Range Status   Specimen Description   Final    BLOOD LEFT FOREARM Performed at [facility], [address] [place], [place] [address]    Special Requests   Final    BOTTLES DRAWN AEROBIC ONLY Blood Culture results may not be optimal due to an inadequate volume of blood received in culture bottles Performed at [facility], [address] [place], [place] [address]    Culture   Final    NO GROWTH 5 DAYS Performed at [facility], [address] [place], [place] [address]    Report Status 06/20/2017 FINAL  Final  Culture, blood (Routine X 2) w Reflex to ID Panel     Status: None   Collection Time: 06/15/17 10:17 AM  Result Value Ref Range Status   Specimen Description   Final    BLOOD LEFT HAND Performed at [facility], [address] [place], [place] [address]    Special Requests   Final    BOTTLES DRAWN AEROBIC ONLY Blood Culture adequate volume Performed at [facility], [address] [place], [place] [address]    Culture   Final    NO GROWTH 5 DAYS Performed at [facility], [address] [place], [place] [address]    Report Status 06/20/2017 FINAL  Final  Culture, blood (routine x 2)     Status: None (Preliminary result)   Collection Time: 06/20/17  9:02 PM  Result Value Ref Range Status   Specimen Description   Final    BLOOD BLOOD LEFT FOREARM Performed at [facility], [address] [place], [place] [address]    Special Requests   Final    BOTTLES DRAWN AEROBIC ONLY Blood Culture adequate volume Performed at [facility], [address]  [place], [place] [address]    Culture   Final    NO GROWTH < 24 HOURS Performed at [facility], [address] [place], [place] [address]    Report Status PENDING  Incomplete  Culture, blood (routine x 2)     Status: None (Preliminary result)   Collection Time: 06/20/17  9:02 PM  Result Value Ref Range Status   Specimen Description   Final    BLOOD LEFT FINGER Performed at [facility], [address] [place], [place] [address]    Special Requests   Final    BOTTLES DRAWN AEROBIC ONLY Blood Culture results may not be optimal due to an inadequate volume of blood received in culture bottles Performed at [facility], [address] [place], [place] [address]    Culture   Final    NO GROWTH < 24 HOURS Performed at [facility], [address] [place], [place] [address]    Report Status PENDING  Incomplete         Radiology Studies: Ct Head Wo Contrast  Result Date: 06/20/2017 CLINICAL DATA:  Fever, drug abuse, endocarditis. EXAM: CT HEAD WITHOUT CONTRAST TECHNIQUE: Contiguous axial images were obtained from the base of the skull through the vertex without intravenous contrast. COMPARISON:  None. FINDINGS: BRAIN: No intraparenchymal hemorrhage, mass effect nor midline shift. The ventricles and sulci are normal. Cavum velum interpositum. No acute large vascular territory infarcts. No abnormal extra-axial  fluid collections. Basal cisterns are patent. VASCULAR: Unremarkable. SKULL/SOFT TISSUES: No skull fracture. No significant soft tissue swelling. ORBITS/SINUSES: The included ocular globes and orbital contents are normal.The mastoid aircells and included paranasal sinuses are well-aerated. OTHER: None. IMPRESSION: Normal noncontrast CT HEAD. Electronically Signed   By: Awilda Metro M.D.   On: 06/20/2017 23:20      Scheduled Meds: . buPROPion  300 mg Oral Daily  . diclofenac sodium  2 g Topical QID  . divalproex  125 mg Oral QHS    . enoxaparin (LOVENOX) injection  40 mg Subcutaneous Daily  . feeding supplement (ENSURE ENLIVE)  237 mL Oral TID BM  . methadone  70 mg Oral Daily  . multivitamin with minerals  1 tablet Oral Daily  . rifampin  300 mg Oral TID  . risperiDONE  0.25 mg Oral QHS  . sertraline  50 mg Oral Daily  . sodium chloride flush  10-40 mL Intracatheter Q12H  . sodium chloride flush  10-40 mL Intracatheter Q12H   Continuous Infusions: . gentamicin Stopped (06/22/17 1105)  . vancomycin Stopped (06/22/17 1135)     LOS: 9 days    Time spent in minutes: 35    Calvert Cantor, MD [facility] Pager: [email or web address] Password [email or web address] 06/22/2017, 12:12 PM

## 2017-06-22 NOTE — Progress Notes (Signed)
Called [facility] bed placement department to check on bed status for patient. [facility] currently at capacity and unable to provide further info about wether patient will receive bed today.

## 2017-06-22 NOTE — Progress Notes (Signed)
Nutrition Follow-up  DOCUMENTATION CODES:   Not applicable  INTERVENTION:   Ensure Enlive po TID, each supplement provides 350 kcal and 20 grams of protein  NUTRITION DIAGNOSIS:   Increased nutrient needs related to acute illness, wound healing as evidenced by estimated needs.  Ongoing  GOAL:   Patient will meet greater than or equal to 90% of their needs  Progressing  MONITOR:   PO intake, Supplement acceptance, Weight trends, Labs, Skin, I & O's  REASON FOR ASSESSMENT:   Malnutrition Screening Tool    ASSESSMENT:   42 y.o. F admitted on 06/12/17 for MRSA and abscess of right leg. PMH of arthritis, juvenile arthritis, hepatitis, pneumonia, drug abuse (last in 2017), bipolar disorder/schizophrenia/PTSD, and seizures. Pt is current smoker and will occasionally drink beer.    Pt found to have bacterial endocarditis. [facility] once bed becomes available.   Reports intake has been off and on according to her lethargy level. States she attempted to eat soup last night but it made her vomit. She has not had anything today. Encouraged pt to order lunch to try again. She has been drinking the Ensure provided to her three times per day and would like to continue with these.   A recent wt has not been obtained since the last RD visit.   Medications reviewed and include: MVI with minerals Labs reviewed.   Diet Order:   Diet Order           Diet regular Room service appropriate? Yes; Fluid consistency: Thin  Diet effective now          EDUCATION NEEDS:   Education needs have been addressed  Skin:  Skin Assessment: Skin Integrity Issues: Skin Integrity Issues:: Other (Comment) Other: abscess R leg  Last BM:  06/21/17  Height:   Ht Readings from Last 1 Encounters:  06/16/17 5\' 5"  (1.651 m)    Weight:   Wt Readings from Last 1 Encounters:  06/16/17 138 lb 10.7 oz (62.9 kg)    Ideal Body Weight:  56.82 kg  BMI:  Body mass  index is 23.08 kg/m.  Estimated Nutritional Needs:   Kcal:  1650-1850 kcal   Protein:  85-100 grams  Fluid:  >/= 1.6 L    Vanessa Kickarly Sivan Quast RD, LDN Clinical Nutrition Pager # [phone]

## 2017-06-22 NOTE — Progress Notes (Signed)
Pharmacy Antibiotic Note  Gloria CostaSabrina Meyer is a 42 y.o. female admitted on 06/12/2017 with MRSA bacteremia and thigh abscess.  Patient has a hx of MRSA endocarditis, now acute prosthetic valve endocarditis .  Pharmacy has been consulted for vancomycin and Gentamicin with confirmation of endocarditis of prosthetic valve.   Today, 06/22/2017 Day # 9 vanc D#6/14 gent D# 5 rifampin Spiking intermittent temps: 101.6 today- per ID this is due to her severe prosthetic tricuspid valve endocarditis. WBC WNL, SCr stable at 0.88. Will check vancomycin and gent peak/trough today  Plan:  Continue gentamcin 100mg  IV q12h  Goal levels (Peak 3-4 mcg/mL and trough < 1 mcg/ml)  continue vancomycin to 1000mg  IV q12h (AUC 518)  Continues on rifampin 300 mg po q8h  Plan 2 weeks gent. 6 weeks vanc/rifampin per ID  Monitor SCr daily while on gent/vanc combination   Height: 5\' 5"  (165.1 cm) Weight: 138 lb 10.7 oz (62.9 kg) IBW/kg (Calculated) : 57  Temp (24hrs), Avg:99 F (37.2 C), Min:98 F (36.7 C), Max:101.6 F (38.7 C)  Recent Labs  Lab 06/16/17 0436  06/18/17 0425 06/18/17 1150 06/18/17 1419 06/19/17 0727 06/19/17 0927 06/20/17 0347 06/20/17 2102 06/21/17 0412 06/22/17 0457  WBC 4.0  --  6.5  --   --   --   --   --   --  9.4  --   CREATININE 0.72   < > 0.73  --   --  0.74  --  0.87  --  0.92 0.88  LATICACIDVEN  --   --   --   --   --   --   --   --  1.9  --   --   VANCOTROUGH  --   --   --  10*  --   --   --   --   --   --   --   VANCOPEAK  --   --   --   --  22*  --   --   --   --   --   --   GENTTROUGH  --   --   --   --   --   --  1.2  --   --   --   --    < > = values in this interval not displayed.    Estimated Creatinine Clearance: 76.5 mL/min (by C-G formula based on SCr of 0.88 mg/dL).    Allergies  Allergen Reactions  . Sulfa Antibiotics Rash   Antimicrobials this admission:  5/26 Vanc >> 5/26 Rifampin >> 5/27  5/30>> 5/26 Zosyn >> 5/26 5/29 gent >>  Dose adjustments  this admission:  5 /28 vanc incr 750 q12 from 1 gm q24  5/31 incr to 1gm q12h with peak and trough results (pk 22, tr 10)  6/1 gent trough ~ 9hr level=1.2, true tr  0.6 - 0.8 6/4 gent pk 12noon = 6/4 gent tr 2130 = Microbiology results:  5/25 BCx: BCID with MRSA 5/28 BCx2> ngF 5/28 MRSA PCR neg 6/2 BCx2>>ng< 24 hrs  Thank you for allowing pharmacy to be a part of this patient's care.  Herby AbrahamMichelle T. Iysis Germain, Pharm.D. [phone] 06/22/2017 10:56 AM

## 2017-06-22 NOTE — Progress Notes (Signed)
Pt [facility] to [facility]. There are no beds available at present time.

## 2017-06-22 NOTE — Progress Notes (Signed)
Patient ID: Ivor CostaSabrina Meyer, female   DOB: 1975-07-14, 42 y.o.   MRN: [id number]          [facility]    Date of Admission:  06/12/2017   Total days of antibiotics 11         She continues to have some intermittent fevers but all recent blood cultures are negative and she has no obvious new source of infection.  I will continue her current 3 drug regimen for MRSA prosthetic tricuspid valve endocarditis.           Cliffton AstersJohn Karrington Mccravy, MD [facility] [phone] pager   [phone] cell 06/22/2017, 5:18 PM

## 2017-06-23 DIAGNOSIS — Z452 Encounter for adjustment and management of vascular access device: Secondary | ICD-10-CM

## 2017-06-23 DIAGNOSIS — T826XXD Infection and inflammatory reaction due to cardiac valve prosthesis, subsequent encounter: Secondary | ICD-10-CM

## 2017-06-23 DIAGNOSIS — F1990 Other psychoactive substance use, unspecified, uncomplicated: Secondary | ICD-10-CM

## 2017-06-23 DIAGNOSIS — I38 Endocarditis, valve unspecified: Secondary | ICD-10-CM

## 2017-06-23 DIAGNOSIS — R509 Fever, unspecified: Secondary | ICD-10-CM

## 2017-06-23 DIAGNOSIS — I368 Other nonrheumatic tricuspid valve disorders: Secondary | ICD-10-CM

## 2017-06-23 LAB — CBC WITH DIFFERENTIAL/PLATELET
BASOS PCT: 0 %
Basophils Absolute: 0 10*3/uL (ref 0.0–0.1)
EOS ABS: 0.1 10*3/uL (ref 0.0–0.7)
EOS PCT: 1 %
HCT: 29.2 % — ABNORMAL LOW (ref 36.0–46.0)
Hemoglobin: 9.4 g/dL — ABNORMAL LOW (ref 12.0–15.0)
Lymphocytes Relative: 11 %
Lymphs Abs: 0.9 10*3/uL (ref 0.7–4.0)
MCH: 30.1 pg (ref 26.0–34.0)
MCHC: 32.2 g/dL (ref 30.0–36.0)
MCV: 93.6 fL (ref 78.0–100.0)
MONO ABS: 0.6 10*3/uL (ref 0.1–1.0)
MONOS PCT: 7 %
NEUTROS ABS: 6.7 10*3/uL (ref 1.7–7.7)
Neutrophils Relative %: 81 %
PLATELETS: 314 10*3/uL (ref 150–400)
RBC: 3.12 MIL/uL — ABNORMAL LOW (ref 3.87–5.11)
RDW: 15.3 % (ref 11.5–15.5)
WBC: 8.4 10*3/uL (ref 4.0–10.5)

## 2017-06-23 LAB — COMPREHENSIVE METABOLIC PANEL
ALBUMIN: 2.3 g/dL — AB (ref 3.5–5.0)
ALT: 11 U/L — ABNORMAL LOW (ref 14–54)
ANION GAP: 8 (ref 5–15)
AST: 17 U/L (ref 15–41)
Alkaline Phosphatase: 77 U/L (ref 38–126)
BILIRUBIN TOTAL: 0.7 mg/dL (ref 0.3–1.2)
BUN: 29 mg/dL — ABNORMAL HIGH (ref 6–20)
CHLORIDE: 97 mmol/L — AB (ref 101–111)
CO2: 30 mmol/L (ref 22–32)
Calcium: 8.8 mg/dL — ABNORMAL LOW (ref 8.9–10.3)
Creatinine, Ser: 1.08 mg/dL — ABNORMAL HIGH (ref 0.44–1.00)
GFR calc Af Amer: >60 mL/min (ref >60)
GLUCOSE: 93 mg/dL (ref 65–99)
POTASSIUM: 5 mmol/L (ref 3.5–5.1)
Sodium: 135 mmol/L (ref 135–145)
TOTAL PROTEIN: 7.3 g/dL (ref 6.5–8.1)

## 2017-06-23 LAB — MAGNESIUM: MAGNESIUM: 2.4 mg/dL (ref 1.7–2.4)

## 2017-06-23 LAB — CREATININE, SERUM: CREATININE: 1.11 mg/dL — AB (ref 0.44–1.00)

## 2017-06-23 LAB — GENTAMICIN LEVEL, TROUGH: GENTAMICIN TR: 1.2 ug/mL (ref 0.5–2.0)

## 2017-06-23 LAB — PHOSPHORUS: Phosphorus: 6.7 mg/dL — ABNORMAL HIGH (ref 2.5–4.6)

## 2017-06-23 MED ORDER — SODIUM CHLORIDE 0.9 % IV SOLN
1250.0000 mg | INTRAVENOUS | Status: DC
  Administered 2017-06-23: 1250 mg via INTRAVENOUS
  Filled 2017-06-23 (×2): qty 1250

## 2017-06-23 MED ORDER — LORAZEPAM 2 MG/ML IJ SOLN
1.0000 mg | Freq: Once | INTRAMUSCULAR | Status: AC
  Administered 2017-06-23: 1 mg via INTRAVENOUS
  Filled 2017-06-23: qty 1

## 2017-06-23 MED ORDER — GENTAMICIN SULFATE 40 MG/ML IJ SOLN
40.0000 mg | Freq: Two times a day (BID) | INTRAVENOUS | Status: DC
  Administered 2017-06-23 – 2017-06-24 (×2): 40 mg via INTRAVENOUS
  Filled 2017-06-23 (×3): qty 1

## 2017-06-23 MED ORDER — HYDROXYZINE HCL 50 MG PO TABS
50.0000 mg | ORAL_TABLET | Freq: Three times a day (TID) | ORAL | Status: DC | PRN
  Administered 2017-06-23: 50 mg via ORAL
  Filled 2017-06-23: qty 1

## 2017-06-23 NOTE — Progress Notes (Signed)
Chaplain met with Gloria Meyer for support around hospitalization, extended illness   Gloria Meyer expresses fear and frustration that her needs are not being met at [facility] and speaks with chaplain about potentially leaving AMA.   Her fear is connected to similar experience at [facility], which resulted in her being intubated and transferred to [facility] for open heart surgery.  Her understanding of our team is framed by this experience.  While at [facility], she was assured she was on appropriate anti-biotics, therefore feels concerned when she hears this reassurance here.  Feels her continued fever is indication that her current anti-biotic regimen is not appropriate.    Chaplain present during pt conversation with MD continued support with [place] after conversation.   Provided support around fear through empathic pastoral presence and recognition and prayer.  Gloria Meyer states on several instances that she is concerned she is not seen or treated as human due to her past substance use.  Engaged in some supportive reality checking, noting difference in Myriah's hearing of MD vs. Chaplain's recollection of his words.    Gloria Meyer is spiritual and speaks of conception of God's care in her life.  Feels God is "telling me to care for myself" and this influences her desire to leave AMA.  Affirmed God's care for her and we spoke about her fear and past experience influencing perception.  Notes "it is difficult for me to be quiet enough to hear God's voice sometimes."   Gloria Meyer has had good experiences with several RNs.  In speaking about this, she relates that she needs to know that people are "fighting for me"   Gloria Meyer is committed to staying through night and hopeful to hear about [facility] tomorrow.  I will follow up with her tomorrow about noon to provide continued support.    [facility] Chaplain Jerene Pitch MDiv, [facility]

## 2017-06-23 NOTE — Progress Notes (Signed)
Patient refused vitals to be taken at this time.

## 2017-06-23 NOTE — Progress Notes (Signed)
[facility]  Date of Admission:  06/12/2017   Total days of antibiotics 12         ASSESSMENT: She is still having intermittent low-grade fever but I told her that I believe she is showing improvement as her last for blood cultures are all negative.  I also told her that her current antibiotic regimen is appropriate and her antibiotic levels are therapeutic.  Her creatinine level has increased slightly and will need to be watched closely.  As mentioned in pharmacies note on 06/18/2017 rifampin does have a strong drug drug interaction with methadone and will reduce methadone levels which can precipitate withdrawal.  PLAN: 1. Continue vancomycin, gentamicin and rifampin 2. Consider increasing methadone dose  Principal Problem:   Prosthetic valve endocarditis, subsequent encounter Active Problems:   Abscess of right leg   MRSA bacteremia   History of prosthetic tricuspid valve replacement   H/O bacterial endocarditis   Heroin abuse (HCC)   Hepatitis C   Bipolar 2 disorder (HCC)   Hypokalemia   Encounter for central line placement   IVDU (intravenous drug user)   Left hip pain   Scheduled Meds: . diclofenac sodium  2 g Topical QID  . divalproex  125 mg Oral QHS  . enoxaparin (LOVENOX) injection  40 mg Subcutaneous Daily  . feeding supplement (ENSURE ENLIVE)  237 mL Oral TID BM  . methadone  70 mg Oral Daily  . multivitamin with minerals  1 tablet Oral Daily  . rifampin  300 mg Oral TID  . risperiDONE  0.25 mg Oral QHS  . sertraline  50 mg Oral Daily  . sodium chloride flush  10-40 mL Intracatheter Q12H  . sodium chloride flush  10-40 mL Intracatheter Q12H   Continuous Infusions: . gentamicin    . vancomycin     PRN Meds:.acetaminophen **OR** acetaminophen, hydrOXYzine, ibuprofen, ondansetron **OR** ondansetron (ZOFRAN) IV, sodium chloride flush, sodium chloride flush, zolpidem   SUBJECTIVE: She is very upset today because she  believes she has been allowed to go through narcotic withdrawal.  She is also very concerned and that she might have developed "immunity" to her current antibiotics.  Review of Systems: Review of Systems  Constitutional: Positive for chills, diaphoresis and fever.  HENT: Negative for congestion and sore throat.   Respiratory: Negative for cough, hemoptysis, sputum production and shortness of breath.   Cardiovascular: Negative for chest pain.  Gastrointestinal: Negative for diarrhea.  Genitourinary: Negative for dysuria.    Allergies  Allergen Reactions  . Sulfa Antibiotics Rash    OBJECTIVE: Vitals:   06/22/17 2256 06/22/17 2300 06/22/17 2301 06/23/17 0523  BP:   107/67 92/61  Pulse: 87  83 75  Resp:   16 16  Temp:  (!) 100.5 F (38.1 C) (!) 100.5 F (38.1 C) 98.2 F (36.8 C)  TempSrc:  Rectal  Oral  SpO2: 97%  98% 97%  Weight:      Height:       Body mass index is 23.08 kg/m.  Physical Exam  Constitutional:  She is currently visiting with the chaplain.  She is tearful.  Skin:  No new skin lesions on her lower extremities in all old lesions appear quite chronic.  No active abscesses or cellulitis noted.    Lab Results Lab Results  Component Value Date   WBC 8.4 06/23/2017   HGB 9.4 (L) 06/23/2017   HCT 29.2 (L) 06/23/2017   MCV 93.6  06/23/2017   PLT 314 06/23/2017    Lab Results  Component Value Date   CREATININE 1.08 (H) 06/23/2017   BUN 29 (H) 06/23/2017   NA 135 06/23/2017   K 5.0 06/23/2017   CL 97 (L) 06/23/2017   CO2 30 06/23/2017    Lab Results  Component Value Date   ALT 11 (L) 06/23/2017   AST 17 06/23/2017   ALKPHOS 77 06/23/2017   BILITOT 0.7 06/23/2017     Microbiology: Recent Results (from the past 240 hour(s))  Culture, blood (Routine X 2) w Reflex to ID Panel     Status: None   Collection Time: 06/13/17  7:10 PM  Result Value Ref Range Status   Specimen Description   Final    BLOOD RIGHT FOREARM Performed at [facility], [address] [place], [place] [address]    Special Requests   Final    BOTTLES DRAWN AEROBIC ONLY Blood Culture results may not be optimal due to an inadequate volume of blood received in culture bottles Performed at [facility], [address], [place], [place] [address]    Culture   Final    NO GROWTH 5 DAYS Performed at [facility], [address], [place], [place] [address]    Report Status 06/18/2017 FINAL  Final  MRSA PCR Screening     Status: None   Collection Time: 06/15/17  8:33 AM  Result Value Ref Range Status   MRSA by PCR NEGATIVE NEGATIVE Final    Comment:        The GeneXpert MRSA Assay (FDA approved for NASAL specimens only), is one component of a comprehensive MRSA colonization surveillance program. It is not intended to diagnose MRSA infection nor to guide or monitor treatment for MRSA infections. Performed at [facility], [address] [place], [place] [address]   Culture, blood (Routine X 2) w Reflex to ID Panel     Status: None   Collection Time: 06/15/17 10:17 AM  Result Value Ref Range Status   Specimen Description   Final    BLOOD LEFT FOREARM Performed at [facility], [address] [place], [place] [address]    Special Requests   Final    BOTTLES DRAWN AEROBIC ONLY Blood Culture results may not be optimal due to an inadequate volume of blood received in culture bottles Performed at [facility], [address], [place], [place] [address]    Culture   Final    NO GROWTH 5 DAYS Performed at [facility], [address], [place], [place] [address]    Report Status 06/20/2017 FINAL  Final  Culture, blood (Routine X 2) w Reflex to ID Panel     Status: None   Collection Time: 06/15/17 10:17 AM  Result Value Ref Range Status   Specimen Description   Final    BLOOD LEFT HAND Performed at [facility], [address] [place],  [place] [address]    Special Requests   Final    BOTTLES DRAWN AEROBIC ONLY Blood Culture adequate volume Performed at [facility], [address], [place], [place] [address]    Culture   Final    NO GROWTH 5 DAYS Performed at [facility], [address] [place], [place] [address]    Report Status 06/20/2017 FINAL  Final  Culture, blood (routine x 2)     Status: None (Preliminary  result)   Collection Time: 06/20/17  9:02 PM  Result Value Ref Range Status   Specimen Description   Final    BLOOD BLOOD LEFT FOREARM Performed at [facility], [address] [place], [place] [address]    Special Requests   Final    BOTTLES DRAWN AEROBIC ONLY Blood Culture adequate volume Performed at [facility], [address], [place], [place] [address]    Culture   Final    NO GROWTH 3 DAYS Performed at [facility], [address], [place], [place] [address]    Report Status PENDING  Incomplete  Culture, blood (routine x 2)     Status: None (Preliminary result)   Collection Time: 06/20/17  9:02 PM  Result Value Ref Range Status   Specimen Description   Final    BLOOD LEFT FINGER Performed at [facility], [address], [place], [place] [address]    Special Requests   Final    BOTTLES DRAWN AEROBIC ONLY Blood Culture results may not be optimal due to an inadequate volume of blood received in culture bottles Performed at [facility], [address], [place], [place] [address]    Culture   Final    NO GROWTH 3 DAYS Performed at [facility], [address], [place], [place] [address]    Report Status PENDING  Incomplete    Cliffton Asters, MD [facility] [phone] pager   [phone] cell 06/23/2017, 3:47 PM

## 2017-06-23 NOTE — Progress Notes (Signed)
Called [facility] bed placement to assess if patient would have bed for today. Bed placement verified that patient is still on waiting list and cannot guarantee that patient will have bed today. [facility] bed placement verified number on file for them to call when patient does receive bed.  [facility] bed placement number: [phone]

## 2017-06-23 NOTE — Plan of Care (Signed)
°  Problem: Clinical Measurements: °Goal: Diagnostic test results will improve °Outcome: Progressing °Goal: Respiratory complications will improve °Outcome: Progressing °Goal: Cardiovascular complication will be avoided °Outcome: Progressing °  °Problem: Nutrition: °Goal: Adequate nutrition will be maintained °Outcome: Progressing °  °Problem: Coping: °Goal: Level of anxiety will decrease °Outcome: Progressing °  °Problem: Pain Managment: °Goal: General experience of comfort will improve °Outcome: Progressing °  °Problem: Skin Integrity: °Goal: Risk for impaired skin integrity will decrease °Outcome: Progressing °  °

## 2017-06-23 NOTE — Progress Notes (Signed)
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   She states that she relapsed about 3 months ago and has been injecting heroin into her right thigh. About 2 weeks ago, she noticed 2 sites of abscess. She lysed it openwith a Narcan needle at home and drained white pus that was mixed with brown. Then about 5 days prior to admission, she started to feel ill with general malaise, fatigue, fever, chills, sweats. She was found to have have an MRSA Bacteremia along with another Bacterial Endocarditis. ID was consulted and is on Triple Abx. Currently felt as if she was having withdrawal symptoms. In the process of being transferred to [facility] and has been accepted to Infectious Disease Service Dr. Pilar Plate and currently awaiting bed placement.   Assessment & Plan:   Principal Problem:   Prosthetic valve endocarditis, subsequent encounter Active Problems:   Abscess of right leg   H/O bacterial endocarditis   Heroin abuse ([place])   Hepatitis C   Bipolar 2 disorder (HCC)   Hypokalemia   MRSA bacteremia   Encounter for central line placement   IVDU (intravenous drug user)   Left hip pain   History of prosthetic tricuspid valve replacement  Sepsis 2/2 to Right Thigh Cellulitis with MRSA bacteremia and Bacterial Endocarditis  -Patient was tachycardic, Tachypenic, Hypotensive, and has a source of Infection with Bacteremia and Endocarditis on Admission  -Vanc/zosyn/rifampinstarted in ED due to hx of endocarditis and changed to Vancomycin per ID and is now  on IV Vancomycin, IV Gentamycin and Rifampin  -Blood cultures showed Staphylococcus bacteremia and was MRSA -TTE showed Normal LV systolic function; moderate diastolic dysfunction; s/p TVR. -TEE showed Large Vegetation Across 2 leaflets  -Procalcitonin was 12.25 and improved to 4.36; Lactic Acid Level was 1.9 -CRP was 13.6 and was ESR was 28 -Repeat blood cultures 6/2 showed NGTD at 3 Days  -CT scan with contrast of thigh ordered showed Mild stranding in subcutaneous fat about the right upper leg is compatible with cellulitis. Negative for abscess, myositis or osteomyelitis. Small Baker's cyst is incidentally noted. 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 [place] ECHO as below; TEE confirmed another Vegetation -Checked HIV and Non-Reactive  -As above  -Started on po Methadone 70 mg on 6/3 due to Interaction with Rifampin and will not change dose  Heroin Abuse/Poly Substance Abuse -Used to follow with methadone clinic and relapsed 3 months ago -Resumed Methadone at 60 mg p.o. 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 daily ; 50 mg of Quetiapine nightly changed to Risperdal due to  Interaction with Rifampin  -Resumed Divalproex 125 mg po qHS and Sertraline 50 mg po Daily  Hypokalemia, improved -Patient's potassium this morning was 5.0 -Continue to monitor and replete as necessary -Repeat CMP in the a.m.  Normocytic Anemia -Patient's hemoglobin/hematocrit went from 12.5/35.9 on admission is now stable at 9.4/29.2 -Continue to monitor for signs and symptoms of bleeding -Repeat CBC in the AM.  Thrombocytopenia, improved  -Patient's platelet count on admission was 79 and has dropped all the way down to 48 but improved to 314 -Likely reactive due to infectious etiology  -Continue to monitor and repeat CBC in a.m.  Grade 2 Diastolic Dysfunction -Seen on ECHO -Currently not decompensated  Vaginal Yeast Infection, improved  -Given 1x Dose of Fluconazole 150 mg po Once -C/w Clotrimazole Vaginal Cream Daily qHS  DVT prophylaxis: Enoxaparin 40 mg sq q24h Code Status: FULL CODE Family Communication: Discussed with family at bedside  Disposition Plan: Awaiting Transfer to [facility] for further evaluation for her Tricuspid Valve Endocarditis and MRSA Bacteremia  Consultants:   Infectious Diseases  Cardiology for TEE   Procedures:  TRANSTHORACIC ECHOCARDIOGRAM ------------------------------------------------------------------- Study Conclusions  - Left ventricle: The cavity size was normal. Wall thickness was normal. Systolic function was normal. The estimated ejection fraction was in the range of 60% to 65%. Wall motion was normal; there were no regional wall motion abnormalities. Features are consistent with a pseudonormal left ventricular filling pattern, with concomitant abnormal relaxation and increased filling pressure (grade 2 diastolic dysfunction). - Tricuspid valve: A bioprosthesis was present.  Impressions:  - Normal LV systolic function; moderate diastolic dysfunction; s/p TVR.  TRANSESOPHAGEAL  ECHOCARDIOGRAM Left Ventrical:  Normal LV function, EF 60-65%  Mitral Valve: mild - mod  MR , no vegetation   Aortic Valve:  Normal   Tricuspid Valve: s/p bioprosthetic TVR.   There is a large , complex vegetation involving 2 leaflets.     Pulmonic Valve:  Mild - moderate PI   Left Atrium/ Left atrial appendage: no thrombi   Atrial septum:  No ASD or PFO by color flow   Aorta: normal    Antimicrobials:  Anti-infectives (From admission, onward)   Start     Dose/Rate Route Frequency Ordered Stop   06/23/17 1800  gentamicin (GARAMYCIN) 40 mg in dextrose 5 % 50 mL IVPB     40 mg 102 mL/hr over 30 Minutes Intravenous Every 12 hours 06/23/17 0505     06/23/17 1500  vancomycin (VANCOCIN) 1,250 mg in sodium chloride 0.9 % 250 mL IVPB     1,250 mg 166.7 mL/hr over 90 Minutes Intravenous Every 24 hours 06/23/17 0505     06/21/17 1000  dextrose 5 % 50 mL with gentamicin (GARAMYCIN) 100 mg infusion  Status:  Discontinued     210 mL/hr over 30 Minutes Intravenous Continuous 06/21/17 0906 06/21/17 0909   06/21/17 1000  gentamicin (GARAMYCIN) 100 mg in dextrose 5 % 100 mL IVPB     100 mg 205 mL/hr over 30 Minutes Intravenous  Once 06/21/17 0907 06/21/17 1109   06/21/17 0900  gentamicin (GARAMYCIN) IVPB 100 mg  Status:  Discontinued     100 mg 200 mL/hr over 30 Minutes Intravenous Every 12 hours 06/21/17 0857 06/21/17 0859   06/21/17 0000  gentamicin (GARAMYCIN) 1-0.9 MG/ML-%     100 mg 200 mL/hr over 30  Minutes Intravenous Every 12 hours 06/21/17 0802     06/21/17 0000  rifampin (RIFADIN) 300 MG capsule     300 mg Oral 3 times daily 06/21/17 0802     06/21/17 0000  vancomycin (VANCOCIN) 1-5 GM/200ML-% SOLN     1,000 mg 200 mL/hr over 60 Minutes Intravenous Every 12 hours 06/21/17 0802     06/18/17 2200  gentamicin (GARAMYCIN) IVPB 100 mg  Status:  Discontinued     100 mg 200 mL/hr over 30 Minutes Intravenous Every 12 hours 06/18/17 1043 06/23/17 0504   06/18/17 2200  vancomycin  (VANCOCIN) IVPB 1000 mg/200 mL premix  Status:  Discontinued     1,000 mg 200 mL/hr over 60 Minutes Intravenous Every 12 hours 06/18/17 1524 06/23/17 0504   06/18/17 1100  gentamicin (GARAMYCIN) IVPB 100 mg     100 mg 200 mL/hr over 30 Minutes Intravenous  Once 06/18/17 1041 06/18/17 1224   06/17/17 1000  rifampin (RIFADIN) capsule 300 mg     300 mg Oral 3 times daily 06/17/17 0916 07/29/17 0959   06/17/17 1000  gentamicin (GARAMYCIN) 100 mg in dextrose 5 % 50 mL IVPB  Status:  Discontinued     100 mg 105 mL/hr over 30 Minutes Intravenous Every 12 hours 06/17/17 0934 06/18/17 1043   06/16/17 2200  gentamicin (GARAMYCIN) IVPB 100 mg  Status:  Discontinued     100 mg 200 mL/hr over 30 Minutes Intravenous Every 12 hours 06/16/17 1931 06/17/17 0934   06/15/17 1100  fluconazole (DIFLUCAN) tablet 150 mg     150 mg Oral  Once 06/15/17 0952 06/15/17 1010   06/15/17 1000  vancomycin (VANCOCIN) IVPB 750 mg/150 ml premix  Status:  Discontinued     750 mg 150 mL/hr over 60 Minutes Intravenous Every 12 hours 06/15/17 0749 06/18/17 1524   06/13/17 2200  vancomycin (VANCOCIN) IVPB 1000 mg/200 mL premix  Status:  Discontinued     1,000 mg 200 mL/hr over 60 Minutes Intravenous Every 24 hours 06/13/17 0852 06/15/17 0749   06/13/17 0830  piperacillin-tazobactam (ZOSYN) IVPB 3.375 g  Status:  Discontinued     3.375 g 12.5 mL/hr over 240 Minutes Intravenous Every 8 hours 06/13/17 0823 06/13/17 1555   06/13/17 0030  rifampin (RIFADIN) 300 mg in sodium chloride 0.9 % 100 mL IVPB  Status:  Discontinued     300 mg 200 mL/hr over 30 Minutes Intravenous Every 8 hours 06/13/17 0017 06/14/17 1416   06/13/17 0030  piperacillin-tazobactam (ZOSYN) IVPB 3.375 g     3.375 g 100 mL/hr over 30 Minutes Intravenous  Once 06/13/17 0017 06/13/17 0147   06/13/17 0030  vancomycin (VANCOCIN) IVPB 1000 mg/200 mL premix     1,000 mg 200 mL/hr over 60 Minutes Intravenous  Once 06/13/17 0027 06/13/17 0216     Subjective: Seen  and examined at bedside and was tearful felt as she was withdrawing and had some shakes however was states she is just scared about her current course.  No chest pain, shortness breath, nausea, vomiting.  Does not understand why she still spiking fevers.  No other concerns or complaints at this time and hopeful for bed placement at [facility] today.  Objective: Vitals:   06/22/17 2256 06/22/17 2300 06/22/17 2301 06/23/17 0523  BP:   107/67 92/61  Pulse: 87  83 75  Resp:   16 16  Temp:  (!) 100.5 F (38.1 C) (!) 100.5 F (38.1 C) 98.2 F (36.8 C)  TempSrc:  Rectal  Oral  SpO2: 97%  98% 97%  Weight:      Height:        Intake/Output Summary (Last 24 hours) at 06/23/2017 1505 Last data filed at 06/23/2017 1248 Gross per 24 hour  Intake 670 ml  Output -  Net 670 ml   Filed Weights   06/12/17 2142 06/13/17 0635 06/16/17 1258  Weight: 61.6 kg (135 lb 11.2 oz) 62.9 kg (138 lb 10.7 oz) 62.9 kg (138 lb 10.7 oz)   Examination: Physical Exam:  Constitutional: Thin Caucasian female appears anxious but in NAD Eyes: Lids and conjunctivae normal, sclerae anicteric  ENMT: External Ears, Nose appear normal. Grossly normal hearing. Mucous membranes are moist. Neck: Appears normal, supple, no cervical masses, normal ROM, no appreciable thyromegaly; no JVD Respiratory: Diminished to auscultation bilaterally, no wheezing, rales, rhonchi or crackles. Normal respiratory effort and patient is not tachypenic. No accessory muscle use.  Cardiovascular: RRR, no murmurs / rubs / gallops. S1 and S2 auscultated. No extremity edema.  Abdomen: Soft, non-tender, non-distended. No masses palpated. No appreciable hepatosplenomegaly. Bowel sounds positive x4.  GU: Deferred. Musculoskeletal: No clubbing / cyanosis of digits/nails. No joint deformity upper and lower extremities.  Skin: Has tract marks from IVDU and has some bruising and minor ulcerations .  Neurologic: CN 2-12 grossly intact with no focal deficits. Romberg  sign and cerebellar reflexes not assessed.  Psychiatric: Normal judgment and insight. Alert and oriented x 3. Anxious and tearful mood and appropriate affect.   Data Reviewed: I have personally reviewed following labs and imaging studies  CBC: Recent Labs  Lab 06/18/17 0425 06/21/17 0412 06/23/17 0823  WBC 6.5 9.4 8.4  NEUTROABS  --   --  6.7  HGB 9.4* 9.2* 9.4*  HCT 29.1* 28.5* 29.2*  MCV 93.6 92.8 93.6  PLT 112* 252 347   Basic Metabolic Panel: Recent Labs  Lab 06/20/17 0347 06/21/17 0412 06/22/17 0457 06/23/17 0620 06/23/17 0823  NA  --   --   --   --  135  K  --   --   --   --  5.0  CL  --   --   --   --  97*  CO2  --   --   --   --  30  GLUCOSE  --   --   --   --  93  BUN  --   --   --   --  29*  CREATININE 0.87 0.92 0.88 1.11* 1.08*  CALCIUM  --   --   --   --  8.8*  MG  --   --   --   --  2.4  PHOS  --   --   --   --  6.7*   GFR: Estimated Creatinine Clearance: 62.3 mL/min (A) (by C-G formula based on SCr of 1.08 mg/dL (H)). Liver Function Tests: Recent Labs  Lab 06/23/17 0823  AST 17  ALT 11*  ALKPHOS 77  BILITOT 0.7  PROT 7.3  ALBUMIN 2.3*   No results for input(s): LIPASE, AMYLASE in the last 168 hours. No results for input(s): AMMONIA in the last 168 hours. Coagulation Profile: No results for input(s): INR, PROTIME in the last 168 hours. Cardiac Enzymes: No results for input(s): CKTOTAL, CKMB, CKMBINDEX, TROPONINI in the last 168 hours. BNP (last 3 results) No results for input(s): PROBNP in the last 8760 hours. HbA1C: No results for input(s): HGBA1C in the last 72 hours. CBG: No results  for input(s): GLUCAP in the last 168 hours. Lipid Profile: No results for input(s): CHOL, HDL, LDLCALC, TRIG, CHOLHDL, LDLDIRECT in the last 72 hours. Thyroid Function Tests: No results for input(s): TSH, T4TOTAL, FREET4, T3FREE, THYROIDAB in the last 72 hours. Anemia Panel: No results for input(s): VITAMINB12, FOLATE, FERRITIN, TIBC, IRON, RETICCTPCT in  the last 72 hours. Sepsis Labs: Recent Labs  Lab 06/20/17 2102  LATICACIDVEN 1.9    Recent Results (from the past 240 hour(s))  Culture, blood (Routine X 2) w Reflex to ID Panel     Status: None   Collection Time: 06/13/17  7:10 PM  Result Value Ref Range Status   Specimen Description   Final    BLOOD RIGHT FOREARM Performed at [facility], [place] [address] [place], [place] [address]    Special Requests   Final    BOTTLES DRAWN AEROBIC ONLY Blood Culture results may not be optimal due to an inadequate volume of blood received in culture bottles Performed at [facility] [address] [place], [place] [address]    Culture   Final    NO GROWTH 5 DAYS Performed at [facility], [place] [address], [place], [place] [address]    Report Status 06/18/2017 FINAL  Final  MRSA PCR Screening     Status: None   Collection Time: 06/15/17  8:33 AM  Result Value Ref Range Status   MRSA by PCR NEGATIVE NEGATIVE Final    Comment:        The GeneXpert MRSA Assay (FDA approved for NASAL specimens only), is one component of a comprehensive MRSA colonization surveillance program. It is not intended to diagnose MRSA infection nor to guide or monitor treatment for MRSA infections. Performed at [facility], [place] [address], [place], [place] [address]   Culture, blood (Routine X 2) w Reflex to ID Panel     Status: None   Collection Time: 06/15/17 10:17 AM  Result Value Ref Range Status   Specimen Description   Final    BLOOD LEFT FOREARM Performed at [facility] [address], [place], [place] [address]    Special Requests   Final    BOTTLES DRAWN AEROBIC ONLY Blood Culture results may not be optimal due to an inadequate volume of blood received in culture bottles Performed at [facility] [address] [place], [place] [address]    Culture   Final    NO GROWTH 5 DAYS Performed at [facility], [place] [address] [place], [place] [address]    Report Status 06/20/2017 FINAL  Final  Culture, blood (Routine X 2) w Reflex to ID Panel     Status: None   Collection Time: 06/15/17 10:17 AM  Result Value Ref Range Status   Specimen Description   Final    BLOOD LEFT HAND Performed at [facility] [address], [place], [place] [address]    Special Requests   Final    BOTTLES DRAWN AEROBIC ONLY Blood Culture adequate volume Performed at [facility] [address] [place], [place] [address]    Culture   Final    NO GROWTH 5 DAYS Performed at [facility], [place] [address], [place],  [address]    Report Status 06/20/2017 FINAL  Final  Culture, blood (routine x 2)     Status: None (Preliminary result)   Collection Time: 06/20/17  9:02 PM  Result Value Ref Range Status   Specimen Description   Final    BLOOD BLOOD LEFT FOREARM Performed at [facility] [address] [place], [place] [address]    Special Requests   Final    BOTTLES DRAWN AEROBIC ONLY Blood Culture adequate volume Performed at [facility] [address], [place], [place] [address]    Culture   Final    NO GROWTH 3 DAYS Performed at [facility], [place] [address] [place], [place] [address]    Report Status PENDING  Incomplete  Culture, blood (routine x 2)     Status: None (Preliminary result)   Collection Time: 06/20/17  9:02 PM  Result Value Ref Range Status   Specimen Description   Final    BLOOD LEFT FINGER Performed at [facility] [address], [place], [place] [address]    Special Requests   Final    BOTTLES DRAWN AEROBIC ONLY Blood Culture results may not be optimal due to an inadequate volume of blood received in culture bottles Performed at [facility] [address], [place], [place] [address]    Culture   Final    NO GROWTH 3 DAYS Performed at [facility], [place] [address], [place], [place] [address]    Report Status PENDING  Incomplete    Radiology Studies: No results found.  Scheduled Meds: . diclofenac sodium  2 g Topical QID  . divalproex  125 mg Oral QHS  . enoxaparin (LOVENOX) injection  40 mg Subcutaneous Daily  . feeding supplement (ENSURE ENLIVE)  237 mL Oral TID BM  . methadone  70 mg Oral Daily  . multivitamin with minerals  1 tablet Oral Daily  . rifampin  300 mg Oral TID  . risperiDONE  0.25 mg Oral QHS  . sertraline  50 mg Oral Daily  . sodium chloride flush  10-40 mL Intracatheter Q12H  . sodium chloride flush  10-40 mL Intracatheter Q12H   Continuous Infusions: . gentamicin    . vancomycin      LOS: 10 days   Kerney Elbe, DO [facility] Pager [phone]  If 7PM-7AM, please contact night-coverage [email or web address] Password [email or web address] 06/23/2017, 3:05 PM

## 2017-06-23 NOTE — Progress Notes (Signed)
Pt. Voiced concerns about her care while in this facility. Pt. And healthcare POA had [facility], this RN provided education and answered questions. Pt. And POA requested to speak with MD. NP on call [facility] paged and made aware of request. NP spoke with POA on the phone and answered all questions. Ativan 1 mg given to pt. Due to anxiety and agitation. Pt. And POA stating they want to leave AMA. Education provided to pt. And POA. Will continue to monitor pt.

## 2017-06-23 NOTE — Progress Notes (Signed)
Pharmacy Antibiotic Note  Gloria Meyer is a 42 y.o. female admitted on 06/12/2017 with MRSA  bacteremia.  Pharmacy has been consulted for Vancomycin and Gentamicin (synergy) dosing.  Plan: Will change vancomycin to 1250mg  iv q24hr with next dose at 1500 06/23/17 based on obtained peak and trough.    Gentamicin dosing for synergy will be changed to 40mg  iv q12hr based on obtained peak and trough.     Height: 5\' 5"  (165.1 cm) Weight: 138 lb 10.7 oz (62.9 kg) IBW/kg (Calculated) : 57  Temp (24hrs), Avg:99.7 F (37.6 C), Min:98.2 F (36.8 C), Max:101.6 F (38.7 C)  Recent Labs  Lab 06/18/17 0425 06/18/17 1150 06/18/17 1419 06/19/17 0727 06/19/17 0927 06/20/17 0347 06/20/17 2102 06/21/17 0412 06/22/17 0457 06/22/17 1330 06/22/17 2248  WBC 6.5  --   --   --   --   --   --  9.4  --   --   --   CREATININE 0.73  --   --  0.74  --  0.87  --  0.92 0.88  --   --   LATICACIDVEN  --   --   --   --   --   --  1.9  --   --   --   --   VANCOTROUGH  --  10*  --   --   --   --   --   --   --   --  19  VANCOPEAK  --   --  22*  --   --   --   --   --   --  6538  --   GENTTROUGH  --   --   --   --  1.2  --   --   --   --   --  1.2  GENTPEAK  --   --   --   --   --   --   --   --   --  6.2  --     Estimated Creatinine Clearance: 76.5 mL/min (by C-G formula based on SCr of 0.88 mg/dL).    Allergies  Allergen Reactions  . Sulfa Antibiotics Rash    Antimicrobials this admission: 5/26 Vanc >> 5/26 Rifampin >> 5/27  5/30>> 5/26 Zosyn >> 5/26 5/29 gent >>  Dose adjustments this admission: 5 /28 vanc incr 750 q12 from 1 gm q24  5/31 incr to 1gm q12h with peak and trough results (pk 22, tr 10)  6/1 gent trough ~ 9hr level=1.2, true tr  0.6 - 0.8 6/4 vanc peak 1330 = 38 6/4 gent peak 1330 = 6.2 6/4 vanc/gent trough 2130 =   19 (Vanc) / 1.2 (gent) Will change vancomycin from 1gm iv q12hr to 1250mg  iv q24hr (Calculated AUC 450.6, Cmax 38.2, Cmin 7.1) And Gentamicin from 100mg  iv q12hr to 40mg   iv q12hr Calculated Cmax 3.8, Cmin 0.5  Microbiology results: 5/25 BCx: BCID with MRSA 5/28 BCx2> ngF 5/28 MRSA PCR neg 6/2 BCx2>>ng< 24 hrs  Thank you for allowing pharmacy to be a part of this patient's care.  Gloria Meyer, Gloria Meyer 06/23/2017 4:54 AM

## 2017-06-24 LAB — CBC WITH DIFFERENTIAL/PLATELET
BASOS ABS: 0 10*3/uL (ref 0.0–0.1)
BASOS PCT: 0 %
EOS ABS: 0.1 10*3/uL (ref 0.0–0.7)
Eosinophils Relative: 1 %
HEMATOCRIT: 28.7 % — AB (ref 36.0–46.0)
HEMOGLOBIN: 9.3 g/dL — AB (ref 12.0–15.0)
Lymphocytes Relative: 15 %
Lymphs Abs: 1.1 10*3/uL (ref 0.7–4.0)
MCH: 30.5 pg (ref 26.0–34.0)
MCHC: 32.4 g/dL (ref 30.0–36.0)
MCV: 94.1 fL (ref 78.0–100.0)
Monocytes Absolute: 0.9 10*3/uL (ref 0.1–1.0)
Monocytes Relative: 13 %
NEUTROS PCT: 71 %
Neutro Abs: 5.2 10*3/uL (ref 1.7–7.7)
Platelets: 321 10*3/uL (ref 150–400)
RBC: 3.05 MIL/uL — AB (ref 3.87–5.11)
RDW: 15.2 % (ref 11.5–15.5)
WBC: 7.4 10*3/uL (ref 4.0–10.5)

## 2017-06-24 LAB — COMPREHENSIVE METABOLIC PANEL
ALBUMIN: 2.3 g/dL — AB (ref 3.5–5.0)
ALK PHOS: 81 U/L (ref 38–126)
ALT: 12 U/L — AB (ref 14–54)
ANION GAP: 8 (ref 5–15)
AST: 16 U/L (ref 15–41)
BUN: 37 mg/dL — AB (ref 6–20)
CALCIUM: 9.1 mg/dL (ref 8.9–10.3)
CO2: 31 mmol/L (ref 22–32)
Chloride: 97 mmol/L — ABNORMAL LOW (ref 101–111)
Creatinine, Ser: 1.19 mg/dL — ABNORMAL HIGH (ref 0.44–1.00)
GFR calc Af Amer: >60 mL/min (ref >60)
GFR calc non Af Amer: 56 mL/min — ABNORMAL LOW (ref >60)
GLUCOSE: 96 mg/dL (ref 65–99)
Potassium: 5.3 mmol/L — ABNORMAL HIGH (ref 3.5–5.1)
SODIUM: 136 mmol/L (ref 135–145)
Total Bilirubin: 0.5 mg/dL (ref 0.3–1.2)
Total Protein: 7.3 g/dL (ref 6.5–8.1)

## 2017-06-24 LAB — MAGNESIUM: Magnesium: 2.6 mg/dL — ABNORMAL HIGH (ref 1.7–2.4)

## 2017-06-24 LAB — PHOSPHORUS: Phosphorus: 6.9 mg/dL — ABNORMAL HIGH (ref 2.5–4.6)

## 2017-06-24 MED ORDER — OXYCODONE HCL 5 MG PO TABS
5.0000 mg | ORAL_TABLET | Freq: Four times a day (QID) | ORAL | Status: DC | PRN
Start: 2017-06-24 — End: 2017-06-24
  Administered 2017-06-24: 5 mg via ORAL
  Filled 2017-06-24: qty 1

## 2017-06-24 MED ORDER — SODIUM CHLORIDE 0.9 % IV SOLN
INTRAVENOUS | Status: DC
  Administered 2017-06-24: 08:00:00 via INTRAVENOUS

## 2017-06-24 MED ORDER — METHADONE HCL 10 MG/ML PO CONC: 80.0000 mg | Freq: Every day | ORAL | Status: DC

## 2017-06-24 NOTE — Progress Notes (Signed)
Called [facility] bed placement to assess if patient would have a bed today.  Per bed placement verified there is presently no bed assigned but patient remains on waiting list. Gloria Meyer, Gloria Meyer   [facility]: [phone]

## 2017-06-24 NOTE — Discharge Summary (Signed)
Physician Discharge Summary  Gloria Meyer GUY:[id number] DOB: April 24, 1975 DOA: 06/12/2017  PCP: Patient, No Pcp Per  Admit date: 06/12/2017 Discharge date: 06/24/2017  Admitted From: Home Disposition: Left Against Medical Advice  Recommendations for Outpatient Follow-up:  1. Follow up with PCP in 1-2 weeks 2. Please obtain CMP/CBC, Mag, Phos in one week 3. 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   She states that she relapsed about 3 months ago and has been injecting heroin into her right thigh. About 2 weeks ago, she noticed 2 sites of abscess. She lysed it openwith a Narcan needle at home and drained white pus that was mixed with brown. Then about 5 days prior to admission, she started to feel ill with general malaise, fatigue, fever, chills, sweats. She was found to have have an MRSA Bacteremia along with another Bacterial Endocarditis. ID was consulted and is on Triple Abx. Currently felt as if she was having withdrawal symptoms so Methadone was increased. Was In the process of being transferred to [facility] and has been accepted to Infectious Disease Service Dr. Pilar Plate and currently awaiting bed placement however patient decided to sign out Against Medical Advice and Leave the Hospital. Prior to her leaving her PICC line was removed and she was educated about leaving the hospital AMA and risks of worsening, decompensation and even death and patient understood these risks  being of sound mind and still proceeded to leave the hospital.  Discharge Diagnoses:  Principal Problem:   Prosthetic valve endocarditis, subsequent encounter Active Problems:   Abscess of right leg   H/O bacterial endocarditis   Heroin abuse ([place])   Hepatitis C   Bipolar 2 disorder ([place])   Hypokalemia   MRSA bacteremia   Encounter for central line placement   IVDU (intravenous drug user)   Left hip pain   History of prosthetic tricuspid valve replacement  Sepsis 2/2 to Right Thigh Cellulitis with MRSA bacteremia and Bacterial Endocarditis  -Patient was tachycardic, Tachypenic, Hypotensive, and has a source of Infection with Bacteremia and Endocarditis on Admission  -Still intermittently spiking temperatures  -Vanc/Zosyn/Rifampinstarted in ED due to hx of endocarditis and changed to Vancomycin per ID and is now on IV Vancomycin, IV Gentamycin and Rifampin  -Blood cultures showed Staphylococcus bacteremia and was MRSA -TTE showedNormal LV systolic function; moderate diastolic dysfunction; s/p TVR. -TEE showed Large Vegetation Across 2 leaflets  -Procalcitonin was 12.25and improved to 4.36; Lactic Acid Level was 1.9 -CRP was 13.6 and was ESR was 28 -Repeat blood cultures 6/2 showed NGTD at 3 Daysstill -CT scan with contrast of thigh ordered showed Mild stranding in subcutaneous fat about the right upper leg is compatible with cellulitis. Negative for abscess, myositis or osteomyelitis. Small Baker's cyst is incidentally noted. 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 Daily and increased to 70 mg and now to 80 mg, watch QTc  -Added PRN Oxycodone for breakthrough -Checked UDS and is positive for amphetamines, cocaine, and opiates -Will need Close Monitoring -C/w Hydroxyzine for Anxiety  -Patient signed out of the hospital [place] C 1a without Hepatic Coma -Patient states she has not received tx for this, despite recommendation -Check RNAQuantitative and was 2,430,000 and HVC Quantitative Log was 6.386 -HCV Ab was >11.0 -ID Consulted and evaluatingand recommending Outpatient Treatment -Follow up with ID as an outpatient   Bipolar2 Disorder/Schizophrenia/Hx ofSeizures/PTSD -Continue with Bupropion 300 mg p.o. daily ; 50 mg of Quetiapine nightly changed to Risperdal due to Interaction with Rifampin  -Resumed Divalproex 125 mg po qHS and Sertraline 50 mg po Daily  Normocytic Anemia -Patient's hemoglobin/hematocrit went from 12.5/35.9 on admission is now stable at 9.3/28.7 -Continue to monitor for signs and symptoms of bleeding -Repeat CBC as an outpatient as patient left the Hospital AMA  Thrombocytopenia, improved  -Patient's platelet count on admission was 79 and has dropped all the way down to 48but improved to 321 -Likely reactive due to infectious etiology  -Continue to monitor and repeat CBC as an outpatient as patient signed out Against Medical Advice    Grade 2 Diastolic Dysfunction -Seen on ECHO -Currently not decompensated  Vaginal Yeast Infection, improved  -Given 1x Dose of Fluconazole 150 mg po Once -C/w Clotrimazole Vaginal Cream Daily qHS  AKI -Patient's BUN and creatinine worsened over the last few days and was now 37/1.19 -Likely in the setting of IV vancomycin and IV gentamicin--started patient back on IV fluid hydration with normal saline rate 100 and mils per hour -Patient decided she did not want to be in the hospital anymore and signed out AGAINST MEDICAL ADVICE  Hyperkalemia -Patient's K+ this Am was 5.3 -Started IVF Hydration and was to repeat this afternoon -Patient [facility]  Discharge Instructions  Allergies as of 06/24/2017      Reactions   Sulfa Antibiotics Rash      Medication List    STOP taking these medications   methadone 10 MG tablet Commonly known as:  DOLOPHINE     TAKE these medications   divalproex 125 MG DR tablet Commonly known as:  DEPAKOTE Take 125 mg by mouth daily.   risperiDONE 0.25 MG tablet Commonly known as:  RISPERDAL Take 0.25 mg by mouth daily.   sertraline 50 MG tablet Commonly known as:  ZOLOFT Take 50 mg by mouth daily.       Allergies  Allergen Reactions  . Sulfa Antibiotics Rash   Consultations:  Infectious Diseases  Cardiology for TEE  Procedures/Studies: Dg Chest 2 View  Result Date: 06/12/2017 CLINICAL DATA:  Femur abscess, history of heart surgery EXAM: CHEST - 2 VIEW COMPARISON:  12/20/2015 FINDINGS: Post sternotomy changes with  valve prosthesis. No acute opacity or pleural effusion. Normal heart size. No pneumothorax. IMPRESSION: No active cardiopulmonary disease. Electronically Signed   By: Donavan Foil M.D.   On: 06/12/2017 22:35   Ct Head Wo Contrast  Result Date: 06/20/2017 CLINICAL DATA:  Fever, drug abuse, endocarditis. EXAM: CT HEAD WITHOUT CONTRAST TECHNIQUE: Contiguous axial images were obtained from the base of the skull  through the vertex without intravenous contrast. COMPARISON:  None. FINDINGS: BRAIN: No intraparenchymal hemorrhage, mass effect nor midline shift. The ventricles and sulci are normal. Cavum velum interpositum. No acute large vascular territory infarcts. No abnormal extra-axial fluid collections. Basal cisterns are patent. VASCULAR: Unremarkable. SKULL/SOFT TISSUES: No skull fracture. No significant soft tissue swelling. ORBITS/SINUSES: The included ocular globes and orbital contents are normal.The mastoid aircells and included paranasal sinuses are well-aerated. OTHER: None. IMPRESSION: Normal noncontrast CT HEAD. Electronically Signed   By: Elon Alas M.D.   On: 06/20/2017 23:20   Mr Hip Left W Wo Contrast  Result Date: 06/18/2017 CLINICAL DATA:  IV drug abuser new id objects in the right thigh. Left thigh pain and swelling. EXAM: MRI OF THE LEFT HIP WITHOUT AND WITH CONTRAST TECHNIQUE: Multiplanar, multisequence MR imaging was performed both before and after administration of intravenous contrast. CONTRAST:  13 ml MULTIHANCE GADOBENATE DIMEGLUMINE 529 MG/ML IV SOLN COMPARISON:  CT right femur 06/15/2017. FINDINGS: Bones: Bone marrow signal is normal throughout. There is no evidence of osteomyelitis. No fracture or focal lesion. Articular cartilage and labrum Articular cartilage:  Normal. Labrum:  Normal. Joint or bursal effusion Joint effusion:  None. Bursae: Normal. Muscles and tendons Muscles and tendons: A small volume of fluid is seen along fascial planes bilaterally without appreciable enhancement. The appearance is symmetric from right to left. No intramuscular fluid collection is identified. Musculature is preserved. Other findings Miscellaneous: Extensive subcutaneous edema is present throughout the visualized abdomen, pelvis and upper legs. IMPRESSION: Diffuse subcutaneous edema about the imaged abdomen, throughout the pelvis and both upper legs is likely due to volume overload particularly  given lack of subcutaneous enhancement. Small volume of fluid in the fascial compartments of the upper legs bilaterally appears symmetric and also likely due to volume overload. No evidence of abscess, myositis, septic joint or osteomyelitis. Electronically Signed   By: Inge Rise M.D.   On: 06/18/2017 16:08   Ct Femur Right W Contrast  Result Date: 06/15/2017 CLINICAL DATA:  Drug abuser with injections in the right thigh. Right thigh pain swelling for 2 weeks. Malaise, fatigue, fever and chills. EXAM: CT OF THE LOWER RIGHT EXTREMITY WITH CONTRAST TECHNIQUE: Multidetector CT imaging of the lower right extremity was performed according to the standard protocol following intravenous contrast administration. COMPARISON:  Plain films right upper leg 06/12/2017. CONTRAST:  100 ml ISOVUE-300 IOPAMIDOL (ISOVUE-300) INJECTION 61% FINDINGS: Bones/Joint/Cartilage Appear normal throughout without fracture, focal lesion, periosteal reaction or bony destructive change. No evidence of arthropathy about the hip or knee. Ligaments Suboptimally assessed by CT. Muscles and Tendons Intact and normal in appearance. Fat planes within muscle are preserved. No intramuscular abscess is identified. Soft tissues Mild stranding in subcutaneous fat about the thigh is noted. No focal fluid collection is seen. The patient has a trace knee joint effusion. Small Baker's cyst is noted. Imaged intrapelvic contents demonstrate a small amount of free pelvic fluid consistent with physiologic change. IMPRESSION: Mild stranding in subcutaneous fat about the right upper leg is compatible with cellulitis. Negative for abscess, myositis or osteomyelitis. Small Baker's cyst is incidentally noted.  Electronically Signed   By: Inge Rise M.D.   On: 06/15/2017 07:25   Dg Chest Port 1 View  Result Date: 06/20/2017 CLINICAL DATA:  PICC line in place, s/p power flush via IV team. Pt feels her PICC line is not located correctly EXAM: PORTABLE  CHEST 1 VIEW COMPARISON:  Plain film from earlier same day. FINDINGS: PICC line has been repositioned and is now adequately located with tip at the level of the mid/lower SVC. Lungs are clear. No pleural effusion or pneumothorax seen. Heart size and mediastinal contours are within normal limits. IMPRESSION: PICC line is now adequately positioned with tip at the level of the mid/lower SVC. Electronically Signed   By: Franki Cabot M.D.   On: 06/20/2017 10:45   Dg Chest Port 1 View  Result Date: 06/20/2017 CLINICAL DATA:  Right arm PICC placement EXAM: PORTABLE CHEST 1 VIEW COMPARISON:  06/13/2017 FINDINGS: Internal jugular central line is been removed. Right arm PICC is placed, but passes the right neck. Previous median sternotomy and valve replacement. Heart size remains normal. The lungs remain clear. IMPRESSION: Right arm PICC travels up the right neck, presumably in the jugular system. These results will be called to the ordering clinician or representative by the Radiologist Assistant, and communication documented in the PACS or zVision Dashboard. Electronically Signed   By: Nelson Chimes M.D.   On: 06/20/2017 06:51   Dg Chest Port 1 View  Result Date: 06/13/2017 CLINICAL DATA:  42 y/o female inpatient, encounter for central line placement. EXAM: PORTABLE CHEST - 1 VIEW COMPARISON:  06/12/2017 FINDINGS: Right IJ central line to the lower SVC. No pneumothorax. Lungs are clear. Heart size normal.  Previous median sternotomy and valve surgery. No effusion. Visualized bones unremarkable. IMPRESSION: 1. Central line to lower SVC.  No pneumothorax. Electronically Signed   By: Lucrezia Europe M.D.   On: 06/13/2017 15:14   Dg Femur, Min 2 Views Right  Result Date: 06/12/2017 CLINICAL DATA:  Right femur abscess near medial distal aspect of the anterior femur EXAM: RIGHT FEMUR 2 VIEWS COMPARISON:  None. FINDINGS: No acute fracture or malalignment. No periostitis or bone destruction. Soft tissue swelling distal thigh.  IMPRESSION: No acute osseous abnormality Electronically Signed   By: Donavan Foil M.D.   On: 06/12/2017 22:34   [place] Ekg Site Rite  Result Date: 06/18/2017 If Site Rite image not attached, placement could not be confirmed due to current cardiac rhythm.  [place] Ekg Site Rite  Result Date: 06/13/2017 If Site Rite image not attached, placement could not be confirmed due to current cardiac rhythm.  TRANSTHORACIC ECHOCARDIOGRAM ------------------------------------------------------------------- Study Conclusions  - Left ventricle: The cavity size was normal. Wall thickness was normal. Systolic function was normal. The estimated ejection fraction was in the range of 60% to 65%. Wall motion was normal; there were no regional wall motion abnormalities. Features are consistent with a pseudonormal left ventricular filling pattern, with concomitant abnormal relaxation and increased filling pressure (grade 2 diastolic dysfunction). - Tricuspid valve: A bioprosthesis was present.  Impressions:  - Normal LV systolic function; moderate diastolic dysfunction; s/p TVR.  TRANSESOPHAGEAL ECHOCARDIOGRAM Left Ventrical:Normal LV function, EF 60-65%  Mitral Valve:mild - mod MR , no vegetation   Aortic Valve:Normal  Tricuspid Valve:s/p bioprosthetic TVR. There is a large , complex vegetation involving 2 leaflets.   Pulmonic Valve:Mild - moderate PI  Left Atrium/ Left atrial appendage:no thrombi  Atrial septum:No ASD or PFO by color flow  Aorta:normal  Subjective: Seen and examined  this morning and patient was frustrated that she is not getting better.  Discussed with her that she is still waiting to be transferred to [facility] for further care however patient did not want to wait and later this afternoon she decided she wanted to leave [place].  After lengthy discussion with the patient and the risks of signing out [place] with an active infection and bacteremia along with endocarditis I told her that she could have worsening decompensation, worsening of her condition and even death.  Patient understood these risks and still proceeded to sign out [place] and PICC line was removed prior to her being discharged.  Discharge Exam: Vitals:   06/23/17 1803 06/24/17 0858  BP: 106/71 (!) 122/93  Pulse: 95 91  Resp: 16 19  Temp: 98 F (36.7 C) (!) 103 F (39.4 C)  SpO2: 98% 98%   Vitals:   06/23/17 1803 06/23/17 2032 06/24/17 0508 06/24/17 0858  BP: 106/71   (!) 122/93  Pulse: 95   91  Resp: 16   19  Temp: 98 F (36.7 C)   (!) 103 F (39.4 C)  TempSrc: Oral Other (Comment) Other (Comment) Rectal  SpO2: 98%   98%  Weight:      Height:       General: Pt is alert, awake, not in acute distress; Of sound mind to make decisions Cardiovascular: Tachycardic slightly, S1/S2 +, no rubs, no gallops Respiratory: Diminished bilaterally, no wheezing, no rhonchi Abdominal: Soft, NT, ND, bowel sounds + Extremities: no edema, no cyanosis; Has tract Marks and injection sites from IVDU  The results of significant diagnostics from this hospitalization (including imaging, microbiology, ancillary and laboratory) are listed below for reference.    Microbiology: Recent Results (from the past 240 hour(s))  MRSA PCR Screening     Status: None   Collection Time: 06/15/17  8:33 AM  Result Value Ref Range Status   MRSA by PCR NEGATIVE NEGATIVE Final    Comment:        The GeneXpert MRSA Assay (FDA approved for NASAL specimens only), is one component of a comprehensive MRSA colonization surveillance program. It is not intended to diagnose MRSA infection nor to guide or monitor treatment for MRSA infections. Performed at [facility], [place] [address], [place], [place] [address]   Culture, blood (Routine X 2) w Reflex to ID Panel     Status: None   Collection Time: 06/15/17 10:17 AM   Result Value Ref Range Status   Specimen Description   Final    BLOOD LEFT FOREARM Performed at [place] [address] [place], [place] [address]    Special Requests   Final    BOTTLES DRAWN AEROBIC ONLY Blood Culture results may not be optimal due to an inadequate volume of blood received in culture bottles Performed at [place] [address], [place], [place] [address]    Culture   Final    NO GROWTH 5 DAYS Performed at [facility], [place] [address], [place], [place] [address]    Report Status 06/20/2017 FINAL  Final  Culture, blood (Routine X 2) w Reflex to ID Panel     Status: None   Collection Time: 06/15/17 10:17 AM  Result Value Ref Range Status   Specimen Description   Final    BLOOD LEFT HAND Performed at [place] [address] [place], [place] [address]  Special Requests   Final    BOTTLES DRAWN AEROBIC ONLY Blood Culture adequate volume Performed at [place] [address], [place], [place] [address]    Culture   Final    NO GROWTH 5 DAYS Performed at [facility], [place] [address] [place], [place] [address]    Report Status 06/20/2017 FINAL  Final  Culture, blood (routine x 2)     Status: None (Preliminary result)   Collection Time: 06/20/17  9:02 PM  Result Value Ref Range Status   Specimen Description   Final    BLOOD BLOOD LEFT FOREARM Performed at [place] [address] [place], [place] [address]    Special Requests   Final    BOTTLES DRAWN AEROBIC ONLY Blood Culture adequate volume Performed at [place] [address], [place], [place] [address]    Culture   Final    NO GROWTH 3 DAYS Performed at [facility], [place] [address] [place], [place] [address]    Report Status PENDING  Incomplete  Culture, blood (routine x 2)     Status: None (Preliminary result)   Collection Time: 06/20/17   9:02 PM  Result Value Ref Range Status   Specimen Description   Final    BLOOD LEFT FINGER Performed at [place] [address], [place], [place] [address]    Special Requests   Final    BOTTLES DRAWN AEROBIC ONLY Blood Culture results may not be optimal due to an inadequate volume of blood received in culture bottles Performed at [place] [address], [place], [place] [address]    Culture   Final    NO GROWTH 3 DAYS Performed at [facility], [place] [address], [place], [place] [address]    Report Status PENDING  Incomplete    Labs: BNP (last 3 results) No results for input(s): BNP in the last 8760 hours. Basic Metabolic Panel: Recent Labs  Lab 06/21/17 0412 06/22/17 0457 06/23/17 0620 06/23/17 0823 06/24/17 0400  NA  --   --   --  135 136  K  --   --   --  5.0 5.3*  CL  --   --   --  97* 97*  CO2  --   --   --  30 31  GLUCOSE  --   --   --  93 96  BUN  --   --   --  29* 37*  CREATININE 0.92 0.88 1.11* 1.08* 1.19*  CALCIUM  --   --   --  8.8* 9.1  MG  --   --   --  2.4 2.6*  PHOS  --   --   --  6.7* 6.9*   Liver Function Tests: Recent Labs  Lab 06/23/17 0823 06/24/17 0400  AST 17 16  ALT 11* 12*  ALKPHOS 77 81  BILITOT 0.7 0.5  PROT 7.3 7.3  ALBUMIN 2.3* 2.3*   No results for input(s): LIPASE, AMYLASE in the last 168 hours. No results for input(s): AMMONIA in the last 168 hours. CBC: Recent Labs  Lab 06/18/17 0425 06/21/17 0412 06/23/17 0823 06/24/17 0400  WBC 6.5 9.4 8.4 7.4  NEUTROABS  --   --  6.7 5.2  HGB 9.4* 9.2* 9.4* 9.3*  HCT 29.1* 28.5* 29.2* 28.7*  MCV 93.6 92.8 93.6 94.1  PLT 112* 252 314 321   Cardiac Enzymes: No  results for input(s): CKTOTAL, CKMB, CKMBINDEX, TROPONINI in the last 168 hours. BNP: Invalid input(s): POCBNP CBG: No results for input(s): GLUCAP in the last 168 hours. D-Dimer No results for input(s): DDIMER in the last 72 hours. Hgb A1c No results for input(s):  HGBA1C in the last 72 hours. Lipid Profile No results for input(s): CHOL, HDL, LDLCALC, TRIG, CHOLHDL, LDLDIRECT in the last 72 hours. Thyroid function studies No results for input(s): TSH, T4TOTAL, T3FREE, THYROIDAB in the last 72 hours.  Invalid input(s): FREET3 Anemia work up No results for input(s): VITAMINB12, FOLATE, FERRITIN, TIBC, IRON, RETICCTPCT in the last 72 hours. Urinalysis    Component Value Date/Time   COLORURINE YELLOW 06/13/2017 0540   APPEARANCEUR CLOUDY (A) 06/13/2017 0540   LABSPEC <1.005 (L) 06/13/2017 0540   PHURINE 6.5 06/13/2017 0540   GLUCOSEU NEGATIVE 06/13/2017 0540   HGBUR MODERATE (A) 06/13/2017 0540   BILIRUBINUR NEGATIVE 06/13/2017 0540   KETONESUR NEGATIVE 06/13/2017 0540   PROTEINUR NEGATIVE 06/13/2017 0540   NITRITE NEGATIVE 06/13/2017 0540   LEUKOCYTESUR NEGATIVE 06/13/2017 0540   Sepsis Labs Invalid input(s): PROCALCITONIN,  WBC,  LACTICIDVEN Microbiology Recent Results (from the past 240 hour(s))  MRSA PCR Screening     Status: None   Collection Time: 06/15/17  8:33 AM  Result Value Ref Range Status   MRSA by PCR NEGATIVE NEGATIVE Final    Comment:        The GeneXpert MRSA Assay (FDA approved for NASAL specimens only), is one component of a comprehensive MRSA colonization surveillance program. It is not intended to diagnose MRSA infection nor to guide or monitor treatment for MRSA infections. Performed at [facility], [place] [address] [place], [place] [address]   Culture, blood (Routine X 2) w Reflex to ID Panel     Status: None   Collection Time: 06/15/17 10:17 AM  Result Value Ref Range Status   Specimen Description   Final    BLOOD LEFT FOREARM Performed at [place] [address] [place], [place] [address]    Special Requests   Final    BOTTLES DRAWN AEROBIC ONLY Blood Culture results may not be optimal due to an inadequate volume of blood received in culture bottles Performed  at [place] [address] [place], [place] [address]    Culture   Final    NO GROWTH 5 DAYS Performed at [facility], [place] [address] [place], [place] [address]    Report Status 06/20/2017 FINAL  Final  Culture, blood (Routine X 2) w Reflex to ID Panel     Status: None   Collection Time: 06/15/17 10:17 AM  Result Value Ref Range Status   Specimen Description   Final    BLOOD LEFT HAND Performed at [place] [address], [place], [place] [address]    Special Requests   Final    BOTTLES DRAWN AEROBIC ONLY Blood Culture adequate volume Performed at [place] [address] [place], [place] [address]    Culture   Final    NO GROWTH 5 DAYS Performed at [facility], [place] [address] [place], [place] [address]    Report Status 06/20/2017 FINAL  Final  Culture, blood (routine x 2)     Status: None (Preliminary result)   Collection Time: 06/20/17  9:02 PM  Result Value Ref Range Status   Specimen Description   Final    BLOOD BLOOD LEFT FOREARM  Performed at [facility], [place] [address], [place], [place] [address]    Special Requests   Final    BOTTLES DRAWN AEROBIC ONLY Blood Culture adequate volume Performed at [facility] [address], [place], [place] [address]    Culture   Final    NO GROWTH 3 DAYS Performed at [facility], [place] [address] [place], [place] [address]    Report Status PENDING  Incomplete  Culture, blood (routine x 2)     Status: None (Preliminary result)   Collection Time: 06/20/17  9:02 PM  Result Value Ref Range Status   Specimen Description   Final    BLOOD LEFT FINGER Performed at [facility] [address] [place], [place] [address]    Special Requests   Final    BOTTLES DRAWN AEROBIC ONLY Blood Culture results may not be optimal due to an inadequate volume of blood received in culture  bottles Performed at [facility] [address], [place], [place] [address]    Culture   Final    NO GROWTH 3 DAYS Performed at [facility], [place] [address], [place], [place] [address]    Report Status PENDING  Incomplete   Time coordinating discharge: 25 minutes  SIGNED:  Kerney Elbe, DO [facility] 06/24/2017, 2:00 PM Pager [phone]  If 7PM-7AM, please contact night-coverage [email or web address] Password [email or web address]

## 2017-06-24 NOTE — Progress Notes (Signed)
Patient discharged herself against medical advice.

## 2017-06-24 NOTE — Progress Notes (Signed)
Patient refused for her vitals to be taken.

## 2017-06-24 NOTE — Progress Notes (Signed)
Pt wanting to leave hospital AMA.  Pt verbalizes that she understands risks involved with leaving AMA.  Dr. Marland McalpineSheikh notified and is aware that pt is leaving hospital AMA. Pt stated she would remove the PICC line herself, this RN explained the risks to not properly removing PICC line.  Pt agreeable to IV team removing PICC line. Pt states [facility] as she has a provider there.   Maeola Harmanark, Cayleigh Paull Johnson

## 2017-06-24 NOTE — Progress Notes (Signed)
IV Team: Pt PICC line d/c'd. Site unremarkable, no s/s of bleeding noted. Educated pt on staying in bed for 30 min after PICC line removed. Pt still got up and changed her clothes. RN aware.

## 2017-06-25 LAB — HIV-1 RNA QUANT-NO REFLEX-BLD
HIV 1 RNA Quant: <20 copies/mL
LOG10 HIV-1 RNA: UNDETERMINED log10copy/mL

## 2017-06-25 LAB — CULTURE, BLOOD (ROUTINE X 2)
CULTURE: NO GROWTH
CULTURE: NO GROWTH
SPECIAL REQUESTS: ADEQUATE

## 2017-06-25 LAB — HCV RNA QUANT RFLX ULTRA OR GENOTYP
HCV RNA QNT(LOG COPY/ML): 6.13 {Log_IU}/mL
HepC Qn: 1350000 IU/mL

## 2017-06-25 LAB — HEPATITIS C GENOTYPE

## 2017-06-25 LAB — HEPATITIS A ANTIBODY, TOTAL: HEP A TOTAL AB: POSITIVE — AB

## 2017-06-25 LAB — HEPATITIS B SURFACE ANTIGEN: Hepatitis B Surface Ag: NEGATIVE

## 2017-06-25 LAB — HEPATITIS B SURFACE ANTIBODY, QUANTITATIVE: Hepatitis B-Post: 29.3 m[IU]/mL (ref >9.9)

## 2017-06-28 LAB — HEPATITIS C GENOTYPE REFLEX

## 2017-07-06 LAB — MISC LABCORP TEST (SEND OUT)
LABCORP TEST CODE: 96388
Labcorp test code: 96833

## 2017-07-06 LAB — MIC RESULT

## 2017-07-06 LAB — MINIMUM INHIBITORY CONC. (1 DRUG)

## 2019-12-27 IMAGING — CT CT HEAD W/O CM
3 series · 15 of 47 positions shown, 18 images · non-contrast
Comparison: None.

CLINICAL DATA: Fever, drug abuse, endocarditis.

EXAM:
CT HEAD WITHOUT CONTRAST
TECHNIQUE: Contiguous axial images were obtained from the base of the skull
through the vertex without intravenous contrast.

[Series 2: head wo · axial · 0.47mm/px · z∈[-194,-70]mm · 9 of 31 slices shown, 12 images]
[im 3/31  brain]
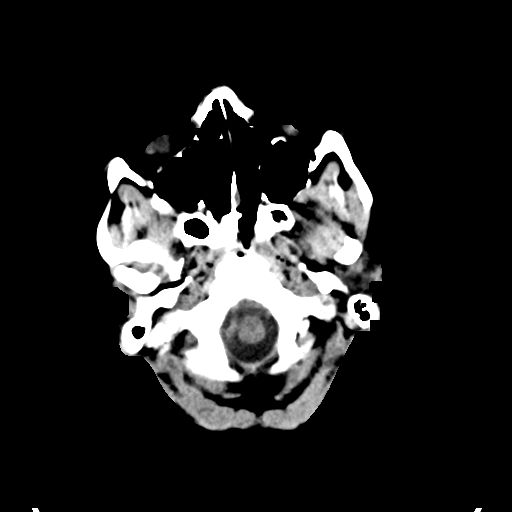
[im 3/31  bone]
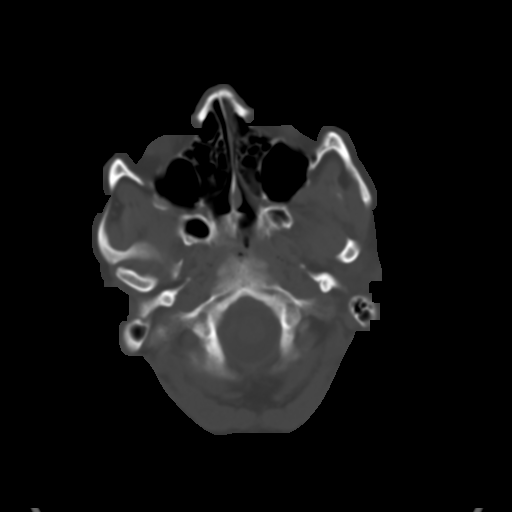
[im 6/31  brain]
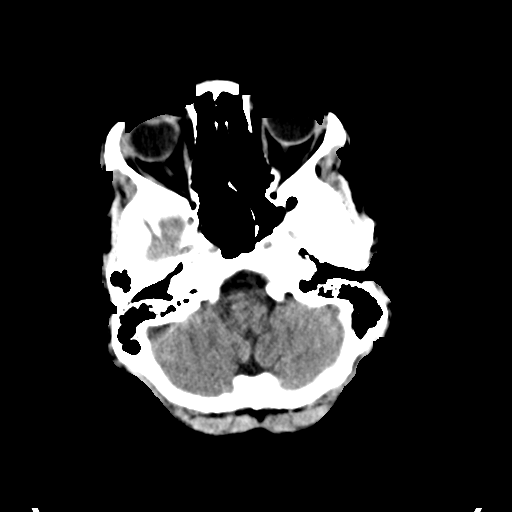
[im 9/31  brain]
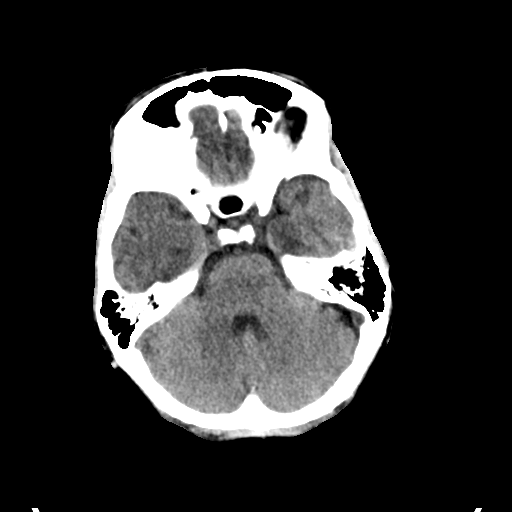
[im 12/31  brain]
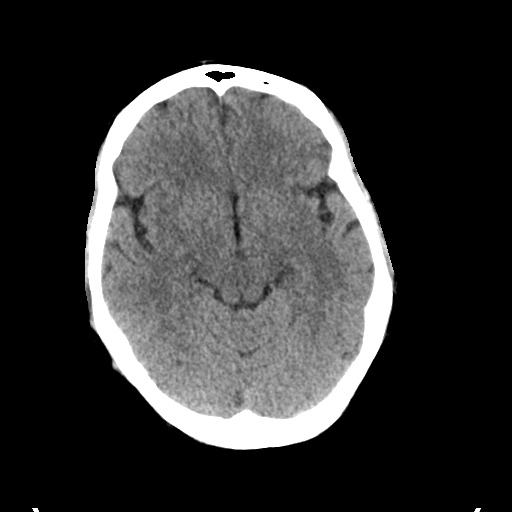
[im 16/31  brain]
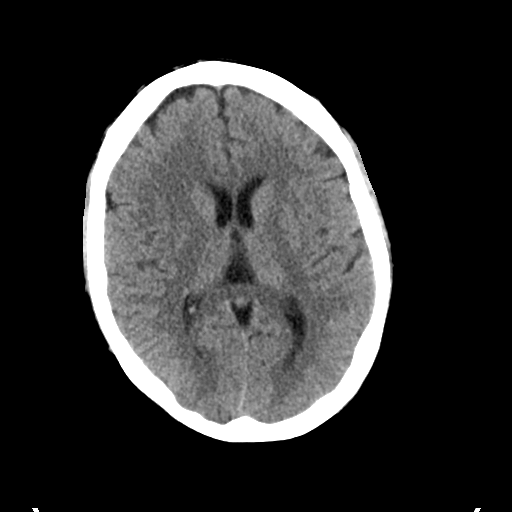
[im 16/31  bone]
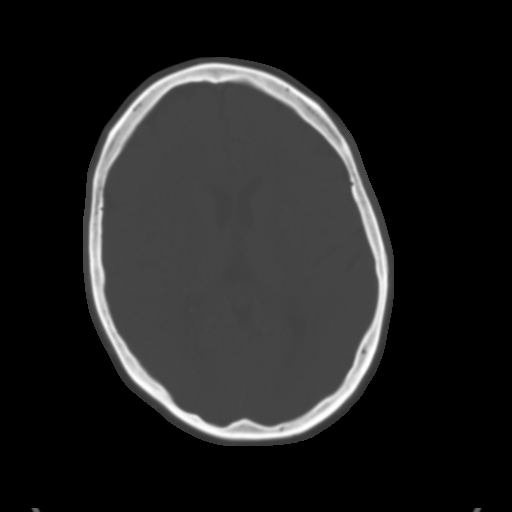
[im 19/31  brain]
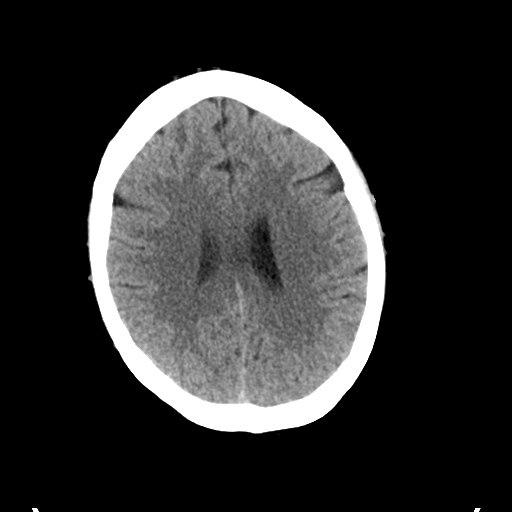
[im 22/31  brain]
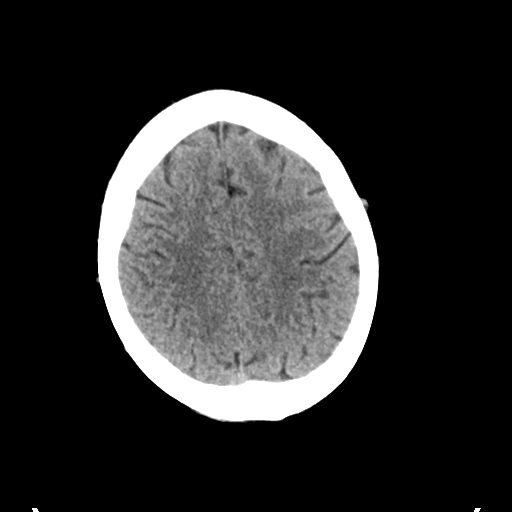
[im 25/31  brain]
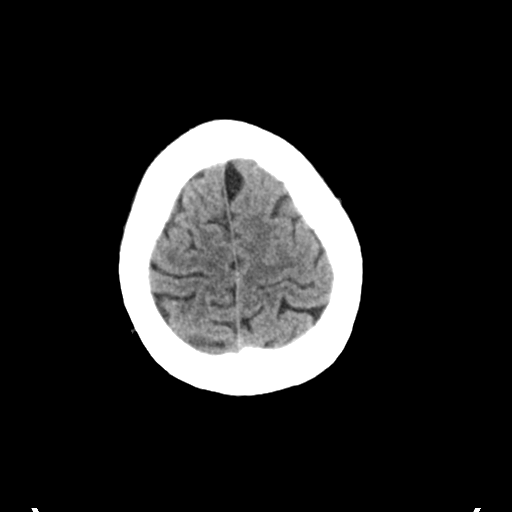
[im 28/31  brain]
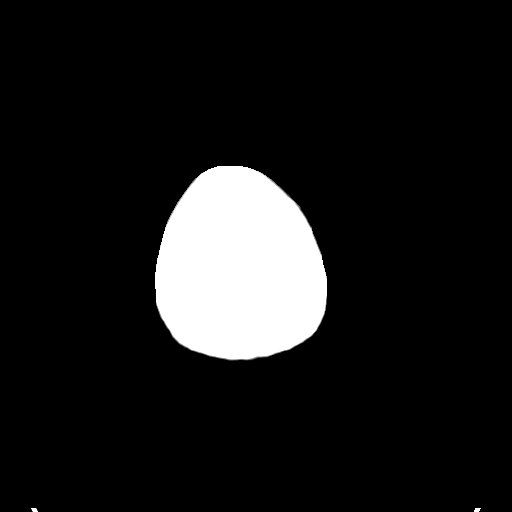
[im 28/31  bone]
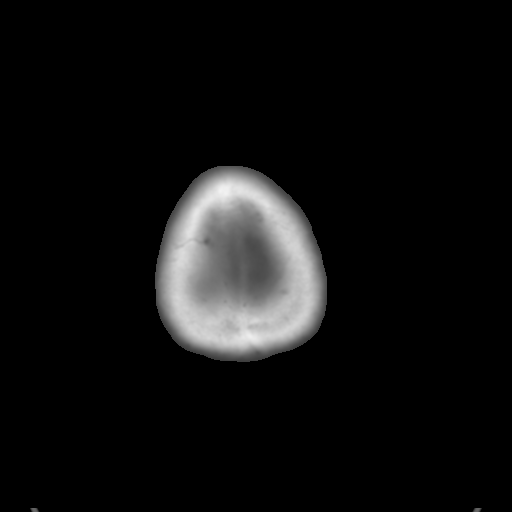

[Series 4: coronal soft tissue · coronal · 0.30mm/px · 3 of 67 slices shown]
[im 23/67  brain]
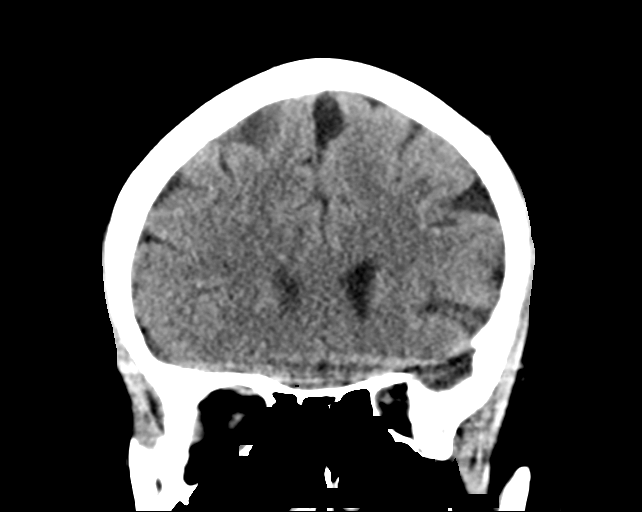
[im 30/67  brain]
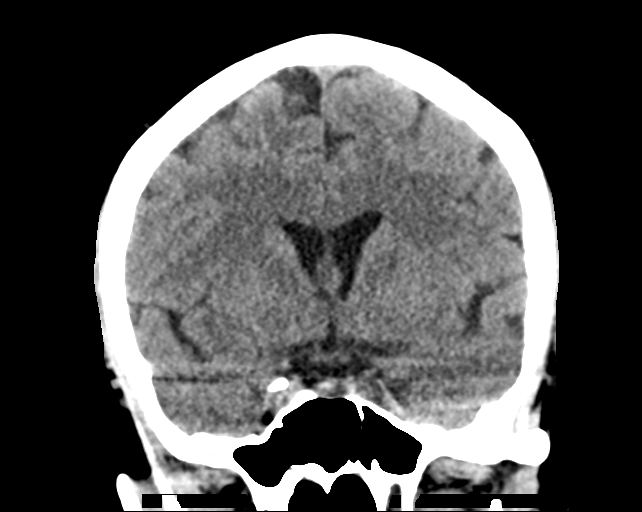
[im 37/67  brain]
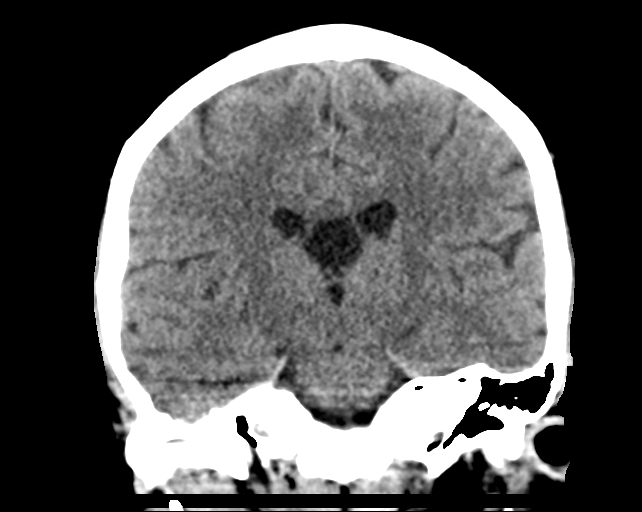

[Series 5: sagittal soft tissue · sagittal · 0.31mm/px · 3 of 67 slices shown]
[im 23/67  brain]
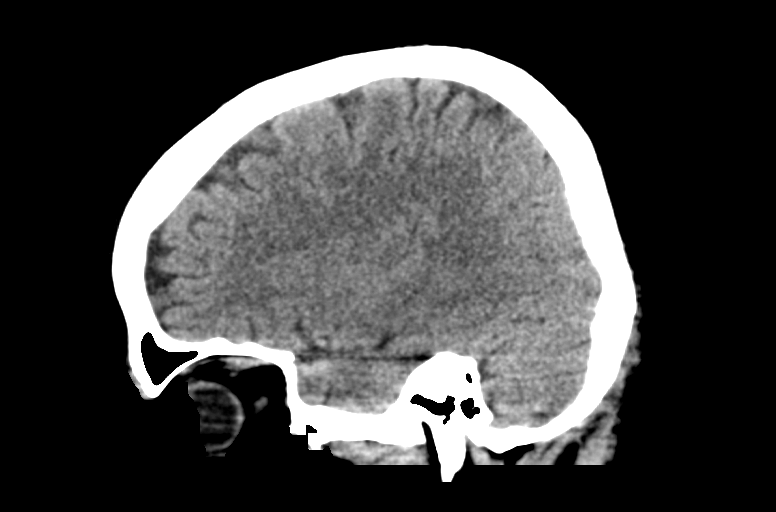
[im 34/67  brain]
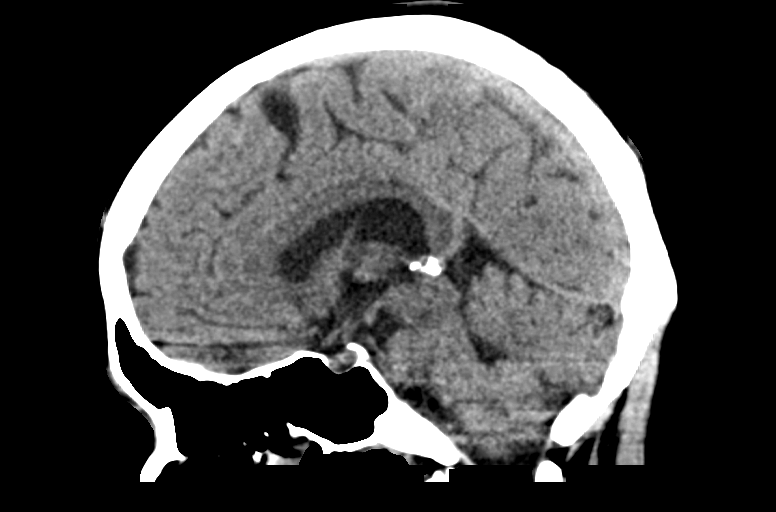
[im 45/67  brain]
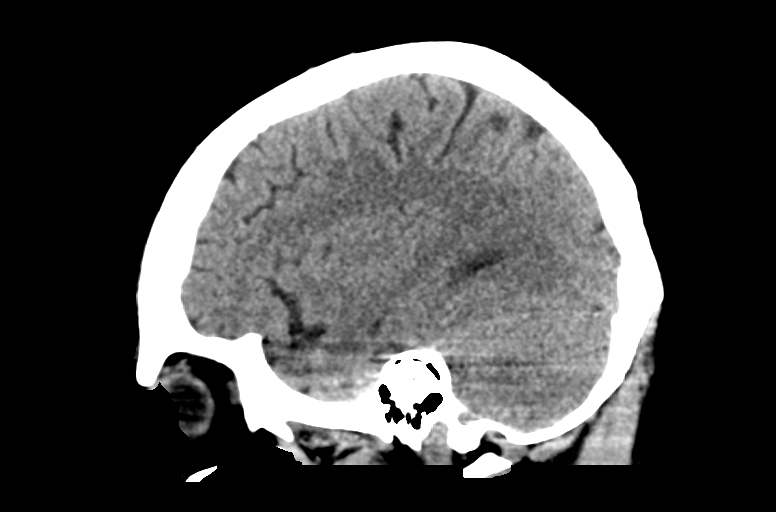

[15 of 47 positions shown; findings below may reference images not displayed]

FINDINGS: BRAIN: No intraparenchymal hemorrhage, mass effect nor midline
shift. The ventricles and sulci are normal. Cavum velum
interpositum. No acute large vascular territory infarcts. No
abnormal extra-axial fluid collections. Basal cisterns are patent.

VASCULAR: Unremarkable.

SKULL/SOFT TISSUES: No skull fracture. No significant soft tissue
swelling.

ORBITS/SINUSES: The included ocular globes and orbital contents are
normal.The mastoid aircells and included paranasal sinuses are
well-aerated.

OTHER: None.
IMPRESSION: Normal noncontrast CT HEAD.

## 2021-06-19 DEATH — deceased
